# Patient Record
Sex: Female | Born: 1935 | Smoking: Never smoker
Health system: Southern US, Community
[De-identification: ages and names within clinical notes are randomized; demographics above are authoritative.]

## PROBLEM LIST (undated history)

## (undated) DIAGNOSIS — R54 Age-related physical debility: Secondary | ICD-10-CM

## (undated) DIAGNOSIS — I739 Peripheral vascular disease, unspecified: Secondary | ICD-10-CM

## (undated) DIAGNOSIS — C189 Malignant neoplasm of colon, unspecified: Secondary | ICD-10-CM

## (undated) DIAGNOSIS — K5900 Constipation, unspecified: Secondary | ICD-10-CM

## (undated) DIAGNOSIS — I639 Cerebral infarction, unspecified: Secondary | ICD-10-CM

## (undated) DIAGNOSIS — I4891 Unspecified atrial fibrillation: Secondary | ICD-10-CM

## (undated) DIAGNOSIS — D649 Anemia, unspecified: Secondary | ICD-10-CM

## (undated) DIAGNOSIS — G459 Transient cerebral ischemic attack, unspecified: Secondary | ICD-10-CM

## (undated) DIAGNOSIS — F0390 Unspecified dementia without behavioral disturbance: Secondary | ICD-10-CM

## (undated) DIAGNOSIS — D469 Myelodysplastic syndrome, unspecified: Secondary | ICD-10-CM

## (undated) HISTORY — DX: Cerebral infarction, unspecified: I63.9

## (undated) HISTORY — DX: Unspecified dementia, unspecified severity, without behavioral disturbance, psychotic disturbance, mood disturbance, and anxiety: F03.90

## (undated) HISTORY — PX: CAROTID ENDARTERECTOMY: SUR193

## (undated) HISTORY — DX: Peripheral vascular disease, unspecified: I73.9

## (undated) HISTORY — DX: Transient cerebral ischemic attack, unspecified: G45.9

## (undated) HISTORY — DX: Unspecified atrial fibrillation: I48.91

## (undated) HISTORY — DX: Malignant neoplasm of colon, unspecified: C18.9

## (undated) HISTORY — DX: Myelodysplastic syndrome, unspecified: D46.9

## (undated) HISTORY — PX: SMALL INTESTINE SURGERY: SHX150

## (undated) HISTORY — DX: Age-related physical debility: R54

## (undated) HISTORY — DX: Anemia, unspecified: D64.9

## (undated) HISTORY — DX: Constipation, unspecified: K59.00

---

## 2004-02-05 ENCOUNTER — Other Ambulatory Visit: Payer: Self-pay

## 2004-05-01 ENCOUNTER — Other Ambulatory Visit: Payer: Self-pay

## 2004-06-18 ENCOUNTER — Ambulatory Visit: Payer: Self-pay | Admitting: Oncology

## 2004-07-19 ENCOUNTER — Ambulatory Visit: Payer: Self-pay | Admitting: Oncology

## 2004-08-18 ENCOUNTER — Ambulatory Visit: Payer: Self-pay | Admitting: Oncology

## 2004-09-18 ENCOUNTER — Ambulatory Visit: Payer: Self-pay | Admitting: Oncology

## 2004-10-19 ENCOUNTER — Ambulatory Visit: Payer: Self-pay | Admitting: Oncology

## 2004-11-16 ENCOUNTER — Ambulatory Visit: Payer: Self-pay | Admitting: Oncology

## 2005-01-05 ENCOUNTER — Ambulatory Visit: Payer: Self-pay | Admitting: Oncology

## 2005-02-02 ENCOUNTER — Ambulatory Visit: Payer: Self-pay | Admitting: Oncology

## 2005-02-06 ENCOUNTER — Other Ambulatory Visit: Payer: Self-pay

## 2005-02-06 ENCOUNTER — Emergency Department: Payer: Self-pay | Admitting: Emergency Medicine

## 2005-02-16 ENCOUNTER — Ambulatory Visit: Payer: Self-pay | Admitting: Oncology

## 2005-03-18 ENCOUNTER — Ambulatory Visit: Payer: Self-pay | Admitting: Oncology

## 2005-04-18 ENCOUNTER — Ambulatory Visit: Payer: Self-pay | Admitting: Oncology

## 2005-05-19 ENCOUNTER — Ambulatory Visit: Payer: Self-pay | Admitting: Oncology

## 2005-06-18 ENCOUNTER — Ambulatory Visit: Payer: Self-pay | Admitting: Oncology

## 2005-07-19 ENCOUNTER — Ambulatory Visit: Payer: Self-pay | Admitting: Oncology

## 2005-08-18 ENCOUNTER — Ambulatory Visit: Payer: Self-pay | Admitting: Oncology

## 2005-09-18 ENCOUNTER — Ambulatory Visit: Payer: Self-pay | Admitting: Oncology

## 2005-10-19 ENCOUNTER — Ambulatory Visit: Payer: Self-pay | Admitting: Oncology

## 2005-11-16 ENCOUNTER — Ambulatory Visit: Payer: Self-pay | Admitting: Oncology

## 2005-12-17 ENCOUNTER — Ambulatory Visit: Payer: Self-pay | Admitting: Oncology

## 2006-01-16 ENCOUNTER — Ambulatory Visit: Payer: Self-pay | Admitting: Oncology

## 2006-02-16 ENCOUNTER — Ambulatory Visit: Payer: Self-pay | Admitting: Oncology

## 2006-03-20 ENCOUNTER — Ambulatory Visit: Payer: Self-pay | Admitting: Oncology

## 2006-05-02 ENCOUNTER — Ambulatory Visit: Payer: Self-pay | Admitting: Oncology

## 2006-05-04 ENCOUNTER — Ambulatory Visit: Payer: Self-pay | Admitting: Internal Medicine

## 2006-05-19 ENCOUNTER — Ambulatory Visit: Payer: Self-pay | Admitting: Oncology

## 2006-06-07 ENCOUNTER — Ambulatory Visit: Payer: Self-pay | Admitting: Gastroenterology

## 2006-07-11 ENCOUNTER — Ambulatory Visit: Payer: Self-pay | Admitting: Oncology

## 2006-07-19 ENCOUNTER — Ambulatory Visit: Payer: Self-pay | Admitting: Oncology

## 2006-08-27 ENCOUNTER — Emergency Department: Payer: Self-pay | Admitting: Emergency Medicine

## 2006-08-27 ENCOUNTER — Other Ambulatory Visit: Payer: Self-pay

## 2006-09-05 ENCOUNTER — Ambulatory Visit: Payer: Self-pay | Admitting: Ophthalmology

## 2006-09-05 ENCOUNTER — Other Ambulatory Visit: Payer: Self-pay

## 2006-09-26 ENCOUNTER — Ambulatory Visit: Payer: Self-pay | Admitting: Ophthalmology

## 2006-10-03 ENCOUNTER — Ambulatory Visit: Payer: Self-pay | Admitting: Oncology

## 2006-10-19 ENCOUNTER — Ambulatory Visit: Payer: Self-pay | Admitting: Oncology

## 2006-11-17 ENCOUNTER — Ambulatory Visit: Payer: Self-pay | Admitting: Oncology

## 2006-12-18 ENCOUNTER — Ambulatory Visit: Payer: Self-pay | Admitting: Oncology

## 2007-01-13 ENCOUNTER — Emergency Department: Payer: Self-pay | Admitting: Emergency Medicine

## 2007-01-14 ENCOUNTER — Ambulatory Visit: Payer: Self-pay | Admitting: Oncology

## 2007-01-17 ENCOUNTER — Ambulatory Visit: Payer: Self-pay | Admitting: Oncology

## 2007-03-04 ENCOUNTER — Ambulatory Visit: Payer: Self-pay | Admitting: Oncology

## 2007-03-19 ENCOUNTER — Ambulatory Visit: Payer: Self-pay | Admitting: Oncology

## 2007-05-20 ENCOUNTER — Ambulatory Visit: Payer: Self-pay | Admitting: Oncology

## 2007-06-03 ENCOUNTER — Ambulatory Visit: Payer: Self-pay | Admitting: Oncology

## 2007-06-19 ENCOUNTER — Ambulatory Visit: Payer: Self-pay | Admitting: Oncology

## 2007-08-19 ENCOUNTER — Ambulatory Visit: Payer: Self-pay | Admitting: Oncology

## 2007-09-04 ENCOUNTER — Ambulatory Visit: Payer: Self-pay | Admitting: Oncology

## 2007-09-19 ENCOUNTER — Ambulatory Visit: Payer: Self-pay | Admitting: Oncology

## 2007-10-20 ENCOUNTER — Ambulatory Visit: Payer: Self-pay | Admitting: Oncology

## 2007-11-17 ENCOUNTER — Ambulatory Visit: Payer: Self-pay | Admitting: Oncology

## 2008-02-17 ENCOUNTER — Ambulatory Visit: Payer: Self-pay | Admitting: Oncology

## 2008-03-12 ENCOUNTER — Ambulatory Visit: Payer: Self-pay | Admitting: Oncology

## 2008-03-18 ENCOUNTER — Ambulatory Visit: Payer: Self-pay | Admitting: Oncology

## 2008-07-10 ENCOUNTER — Ambulatory Visit: Payer: Self-pay | Admitting: Oncology

## 2008-07-19 ENCOUNTER — Ambulatory Visit: Payer: Self-pay | Admitting: Oncology

## 2008-08-04 ENCOUNTER — Emergency Department: Payer: Self-pay | Admitting: Internal Medicine

## 2008-08-18 ENCOUNTER — Ambulatory Visit: Payer: Self-pay | Admitting: Oncology

## 2008-08-21 ENCOUNTER — Emergency Department: Payer: Self-pay | Admitting: Emergency Medicine

## 2008-08-28 ENCOUNTER — Ambulatory Visit: Payer: Self-pay | Admitting: Oncology

## 2008-09-18 ENCOUNTER — Ambulatory Visit: Payer: Self-pay | Admitting: Oncology

## 2008-12-17 ENCOUNTER — Ambulatory Visit: Payer: Self-pay | Admitting: Oncology

## 2008-12-30 ENCOUNTER — Ambulatory Visit: Payer: Self-pay | Admitting: Oncology

## 2009-01-16 ENCOUNTER — Ambulatory Visit: Payer: Self-pay | Admitting: Oncology

## 2009-06-18 ENCOUNTER — Ambulatory Visit: Payer: Self-pay | Admitting: Oncology

## 2009-06-30 ENCOUNTER — Ambulatory Visit: Payer: Self-pay | Admitting: Oncology

## 2009-07-19 ENCOUNTER — Ambulatory Visit: Payer: Self-pay | Admitting: Oncology

## 2009-11-11 ENCOUNTER — Emergency Department: Payer: Self-pay | Admitting: Emergency Medicine

## 2009-12-03 ENCOUNTER — Emergency Department: Payer: Self-pay | Admitting: Emergency Medicine

## 2009-12-13 ENCOUNTER — Emergency Department: Payer: Self-pay | Admitting: Emergency Medicine

## 2009-12-17 ENCOUNTER — Ambulatory Visit: Payer: Self-pay | Admitting: Oncology

## 2009-12-22 ENCOUNTER — Ambulatory Visit: Payer: Self-pay | Admitting: Oncology

## 2010-01-16 ENCOUNTER — Ambulatory Visit: Payer: Self-pay | Admitting: Oncology

## 2010-06-18 ENCOUNTER — Ambulatory Visit: Payer: Self-pay | Admitting: Oncology

## 2010-07-01 ENCOUNTER — Ambulatory Visit: Payer: Self-pay | Admitting: Oncology

## 2010-07-19 ENCOUNTER — Ambulatory Visit: Payer: Self-pay | Admitting: Oncology

## 2010-08-06 ENCOUNTER — Emergency Department: Payer: Self-pay | Admitting: Emergency Medicine

## 2010-09-21 ENCOUNTER — Emergency Department: Payer: Self-pay | Admitting: Unknown Physician Specialty

## 2010-11-01 ENCOUNTER — Emergency Department: Payer: Self-pay | Admitting: Emergency Medicine

## 2010-11-17 ENCOUNTER — Ambulatory Visit: Payer: Self-pay | Admitting: Oncology

## 2010-11-18 ENCOUNTER — Emergency Department: Payer: Self-pay | Admitting: Emergency Medicine

## 2010-11-19 ENCOUNTER — Inpatient Hospital Stay: Payer: Self-pay | Admitting: Internal Medicine

## 2010-12-18 ENCOUNTER — Ambulatory Visit: Payer: Self-pay | Admitting: Oncology

## 2012-04-07 IMAGING — CT CT HEAD WITHOUT CONTRAST
2 series · 15 of 30 positions shown, 19 images · non-contrast
Comparison: none

REASON FOR EXAM: fell hit chair, apparent bruise on head
COMMENTS:

PROCEDURE:     CT  - CT HEAD WITHOUT CONTRAST  - November 18, 2010 [DATE]
RESULT:     Comparison:  09/21/2010
TECHNIQUE: Multiple axial images from the foramen magnum to the vertex were
obtained without IV contrast.

[Series 2: without · axial · non-contrast · 0.39mm/px · z∈[+906,+1036]mm · 13 of 32 slices shown, 17 images]
[im 3/32  brain]
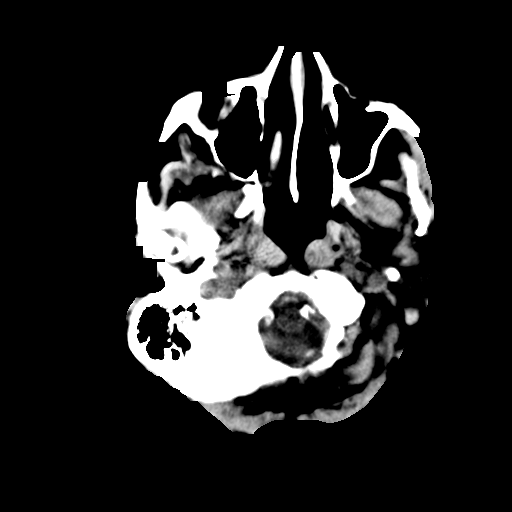
[im 3/32  bone]
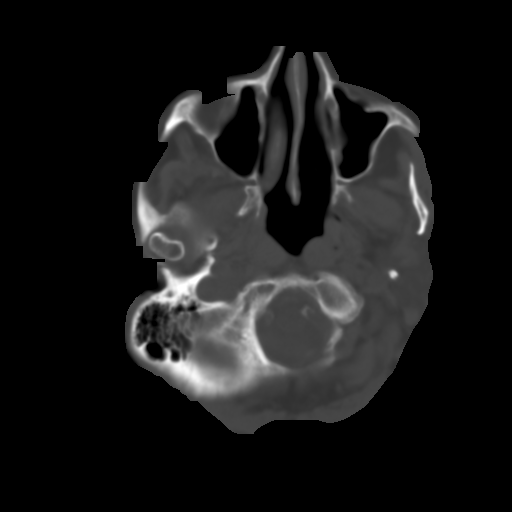
[im 5/32  brain]
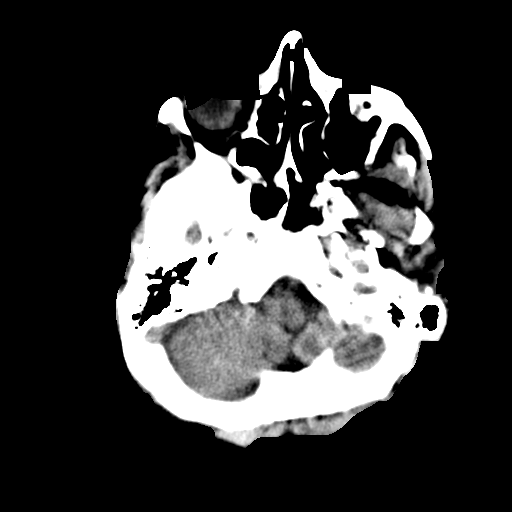
[im 7/32  brain]
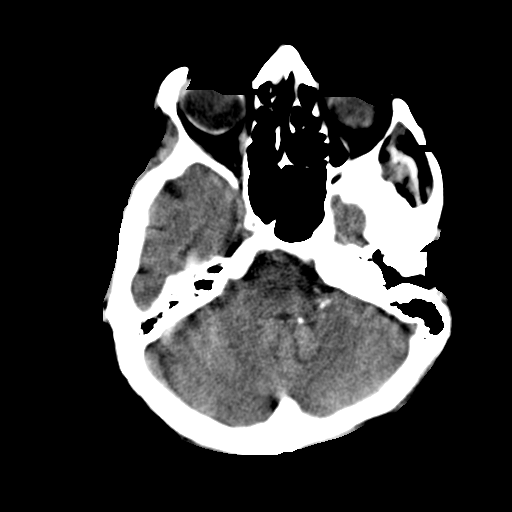
[im 9/32  brain]
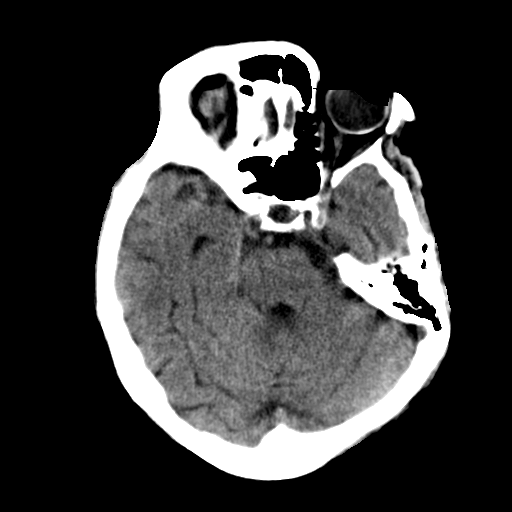
[im 12/32  brain]
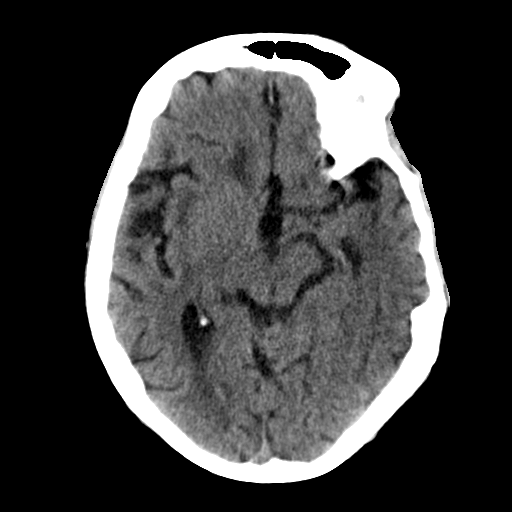
[im 12/32  bone]
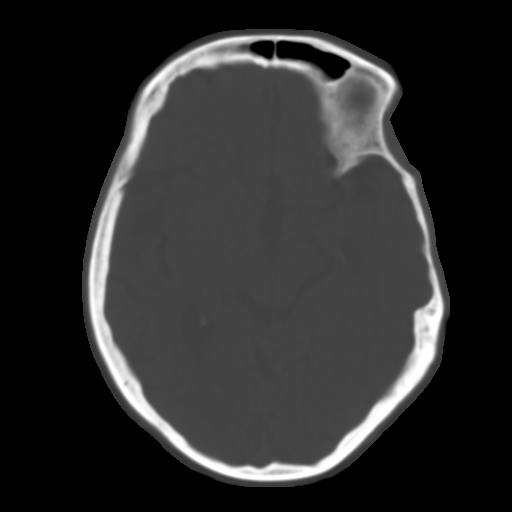
[im 14/32  brain]
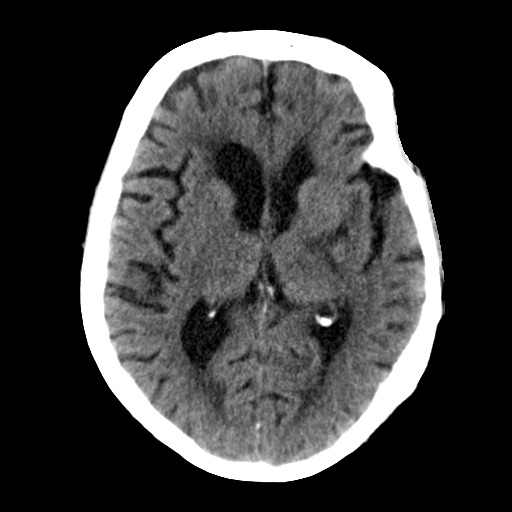
[im 16/32  brain]
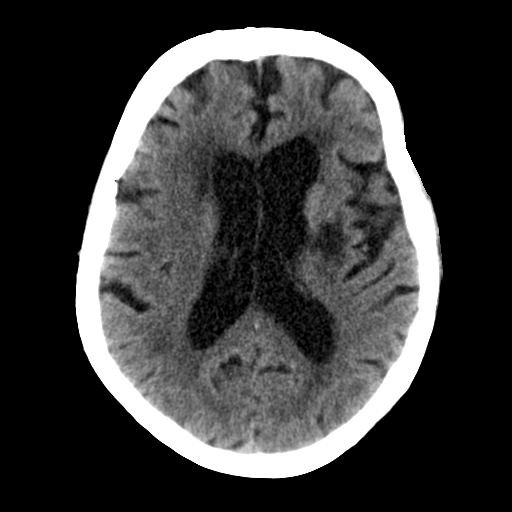
[im 18/32  brain]
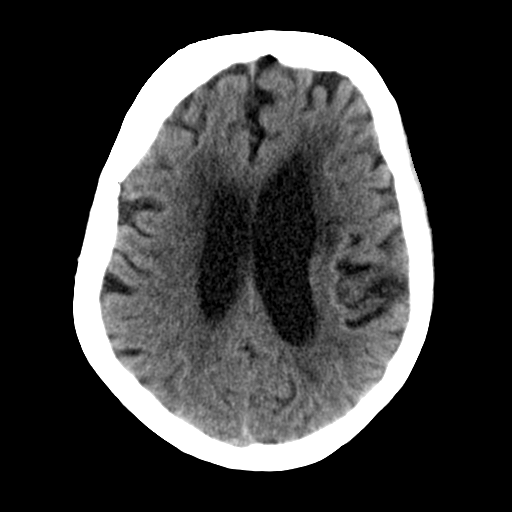
[im 20/32  brain]
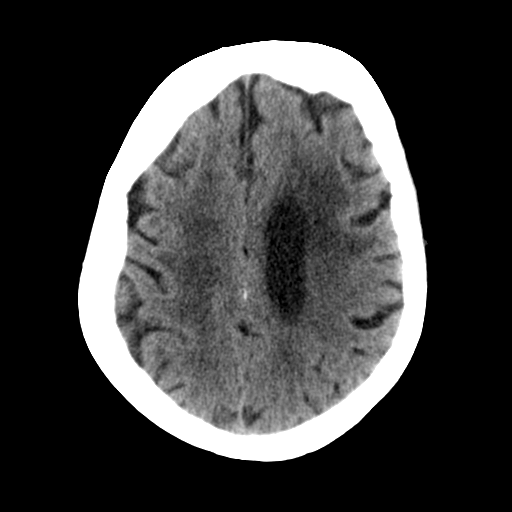
[im 20/32  bone]
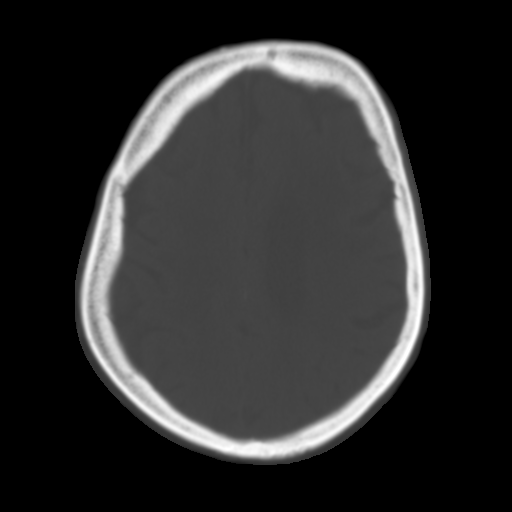
[im 23/32  brain]
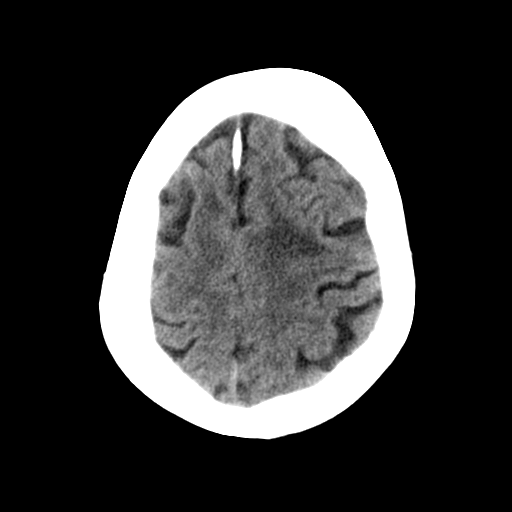
[im 25/32  brain]
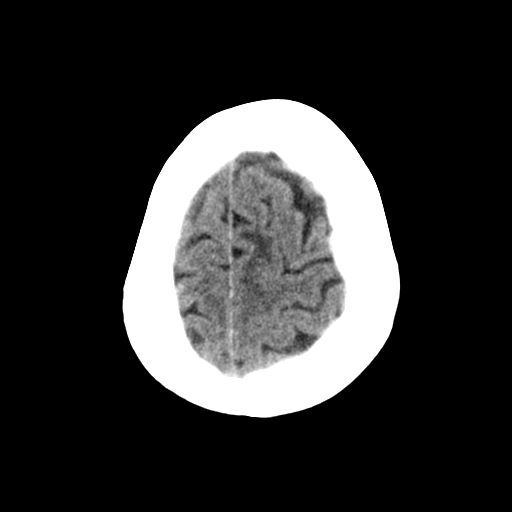
[im 27/32  brain]
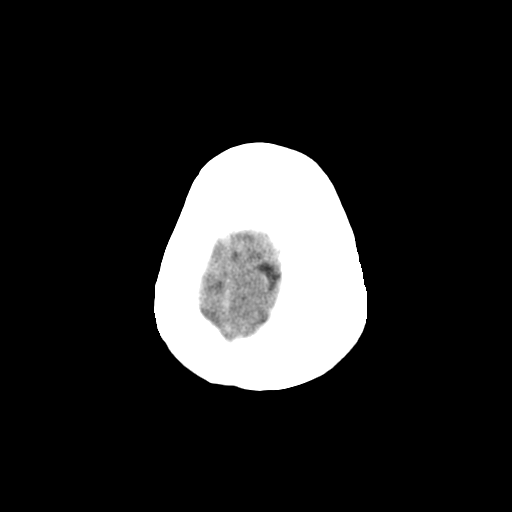
[im 29/32  brain]
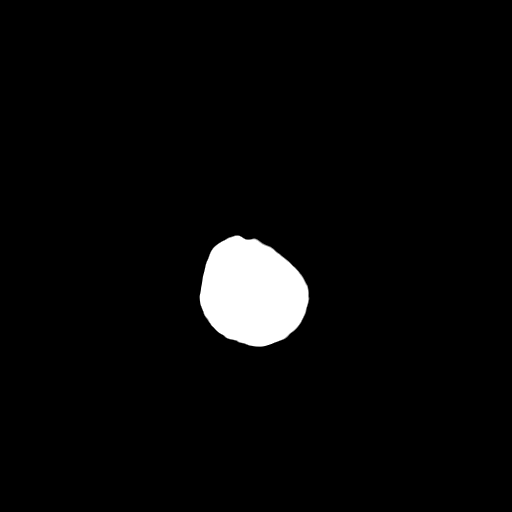
[im 29/32  bone]
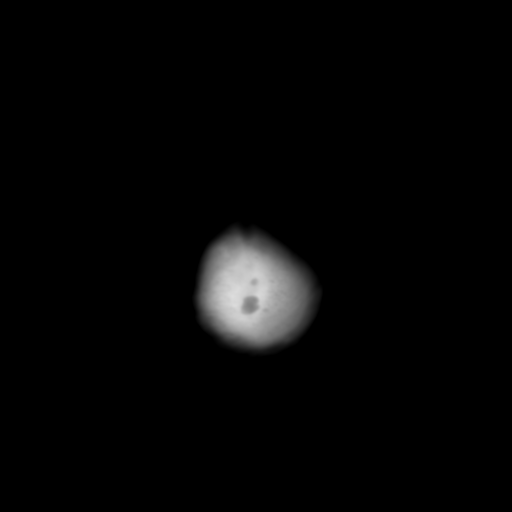

[Series 3: bone · axial · 0.39mm/px · z∈[+906,+926]mm · 2 of 32 slices shown]
[im 3/32  bone]
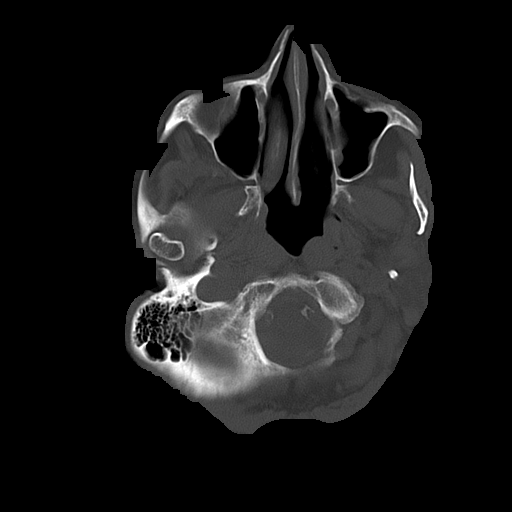
[im 7/32  bone]
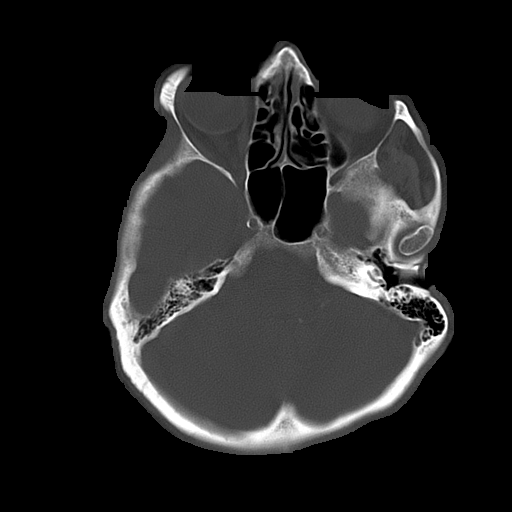

[15 of 30 positions shown; findings below may reference images not displayed]

FINDINGS: There is no evidence of mass effect, midline shift, or extra-axial fluid
collections.  There is no evidence of a space-occupying lesion or
intracranial hemorrhage. There is no evidence of a cortical-based area of
acute infarction. There is an old left lacunar infarct. There is generalized
cerebral atrophy. There is periventricular white matter low attenuation
likely secondary to microangiopathy.

The ventricles and sulci are appropriate for the patient's age. The basal
cisterns are patent.

Visualized portions of the orbits are unremarkable. The visualized portions
of the paranasal sinuses and mastoid air cells are unremarkable.
Cerebrovascular atherosclerotic calcifications are noted.

The osseous structures are unremarkable.
IMPRESSION: No acute intracranial process.

## 2012-04-08 IMAGING — CT CT CERVICAL SPINE WITHOUT CONTRAST
3 series · 14 of 27 positions shown, 17 images · non-contrast
Comparison: None

REASON FOR EXAM: neck pain
COMMENTS:

PROCEDURE:     CT  - CT CERVICAL SPINE WO  - November 19, 2010  [DATE]
RESULT:     Clinical Indication: Trauma
TECHNIQUE: Multiple axial CT images from the skull base to the mid vertebral
body of T1. obtained with sagittal and coronal reformatted images provided.

[Series 5: sagittal · sagittal · 0.22mm/px · 5 of 43 slices shown, 6 images]
[im 15/43  bone]
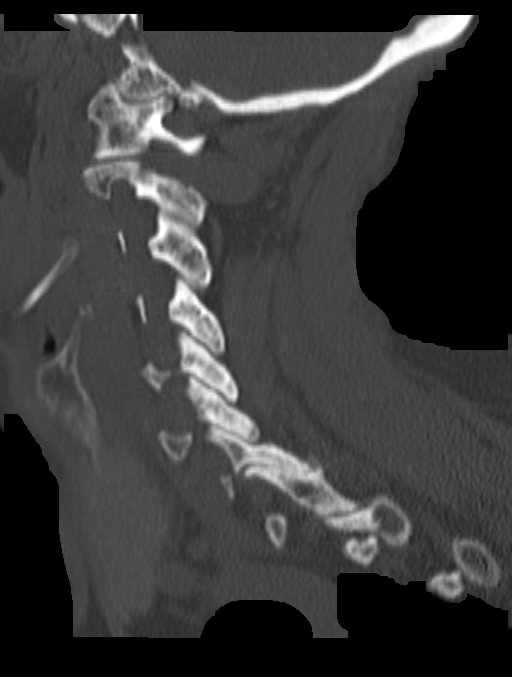
[im 18/43  bone]
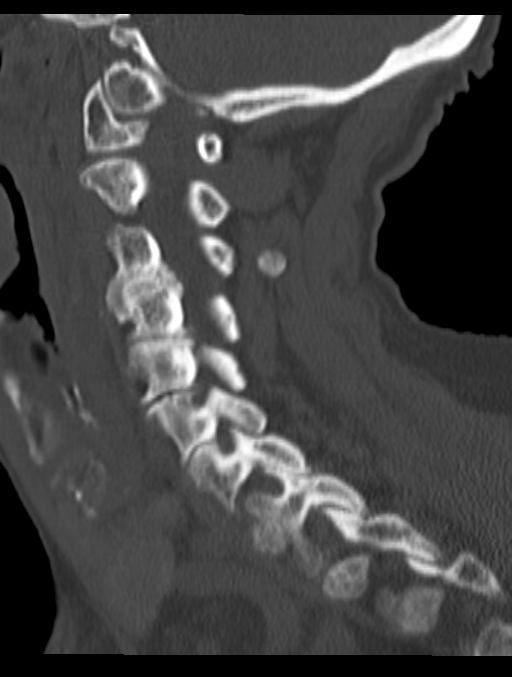
[im 22/43  soft-tissue]
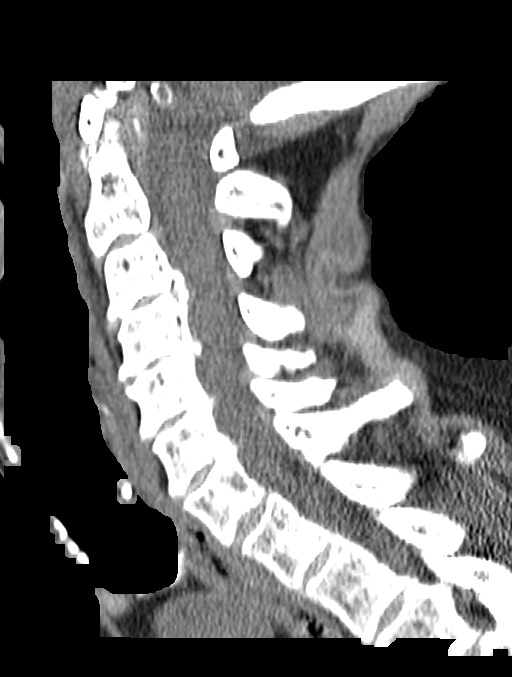
[im 22/43  bone]
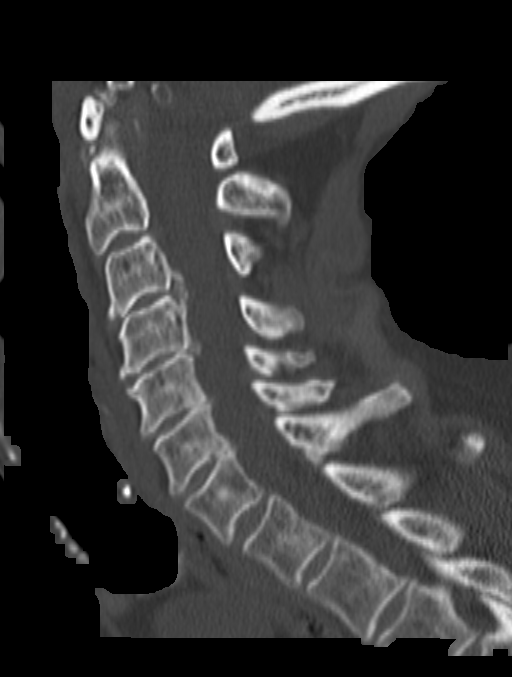
[im 25/43  bone]
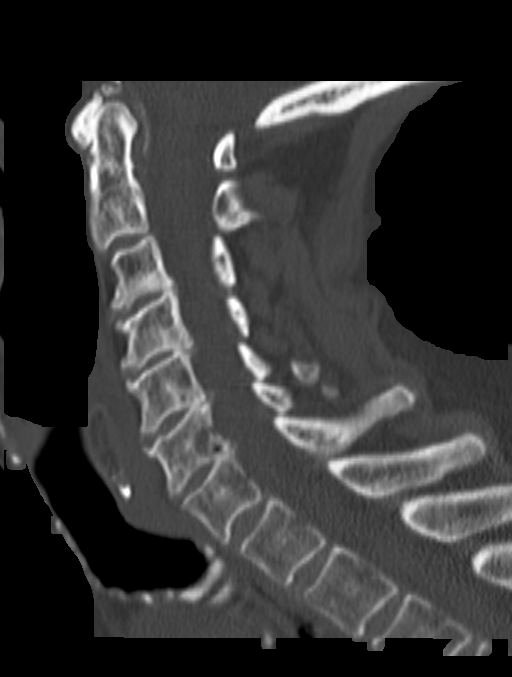
[im 29/43  bone]
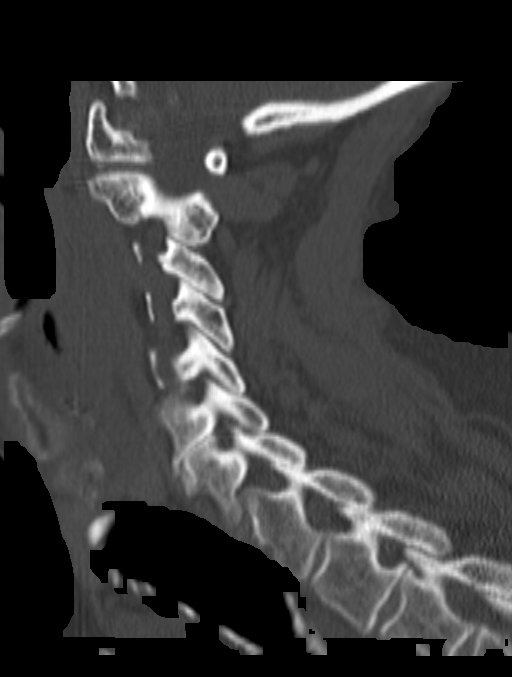

[Series 6: axial · axial · 0.29mm/px · z∈[+847,+943]mm · 5 of 72 slices shown, 7 images (1 of 2)]
[im 12/72  soft-tissue]
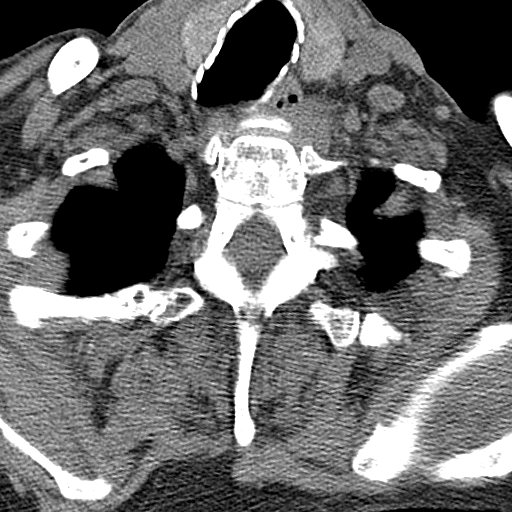
[im 12/72  bone]
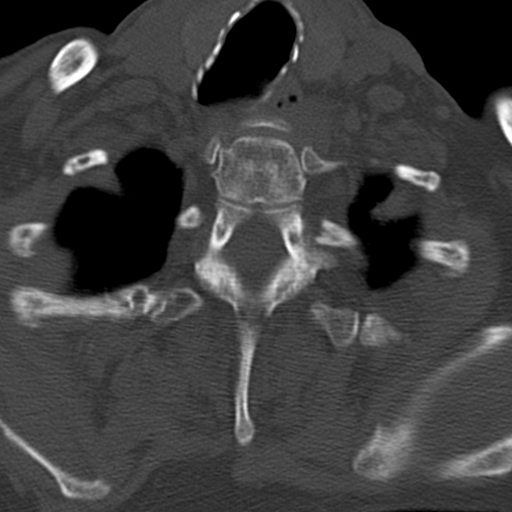
[im 24/72  bone]
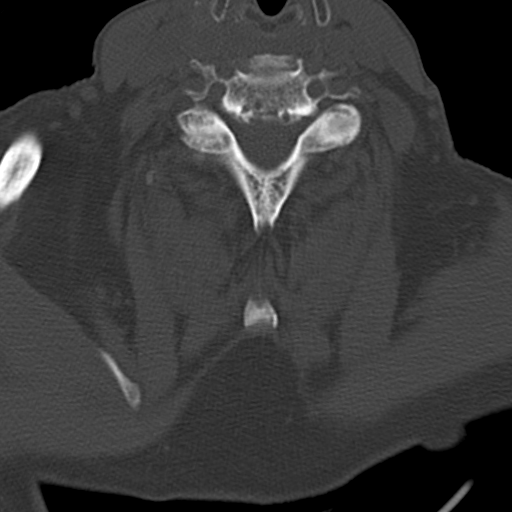
[im 36/72  bone]
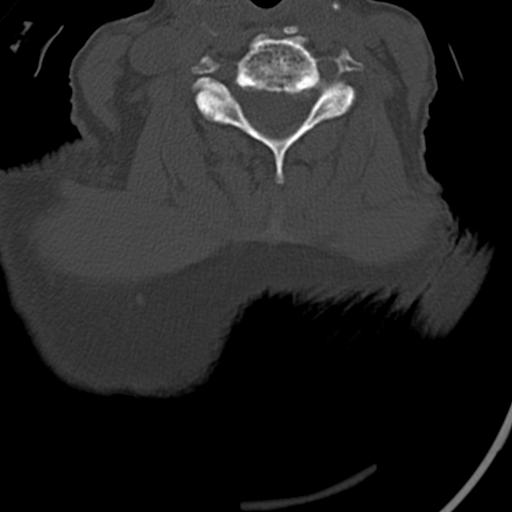
[im 48/72  bone]
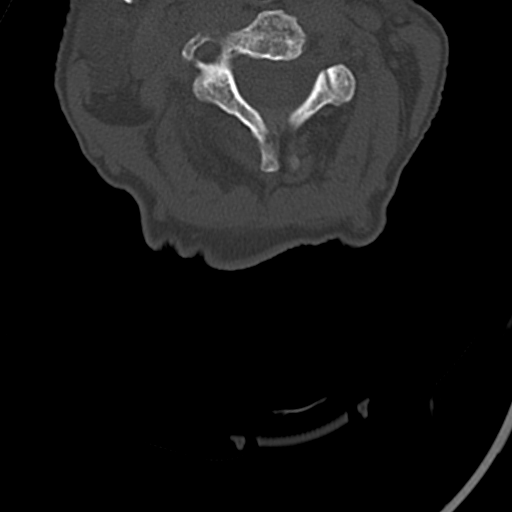
[im 60/72  soft-tissue]
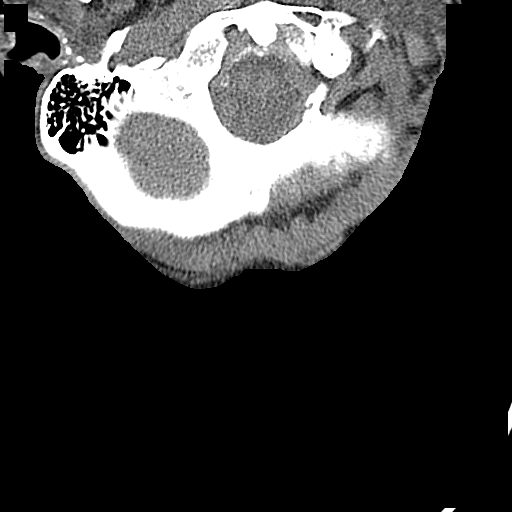
[im 60/72  bone]
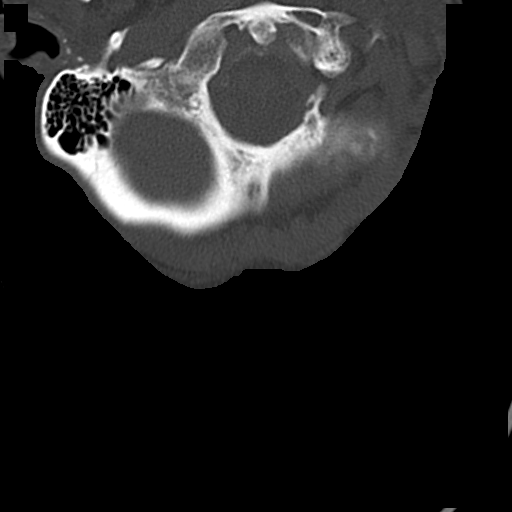

[Series 7: axial · axial · 0.35mm/px · z∈[+846,+914]mm · 4 of 69 slices shown (2 of 2)]
[im 12/69  bone]
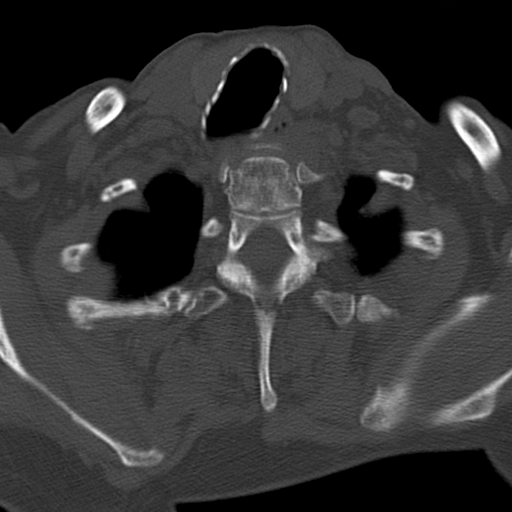
[im 23/69  bone]
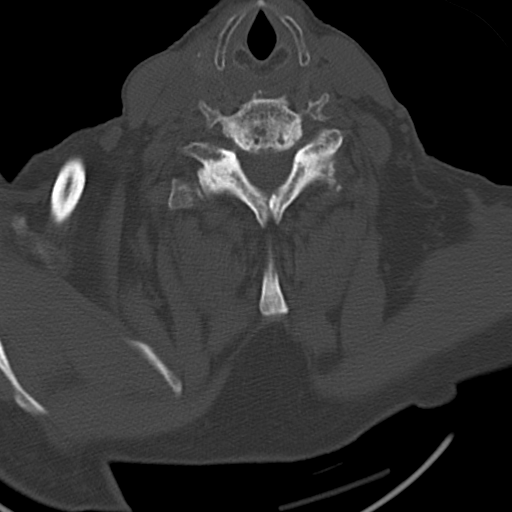
[im 35/69  bone]
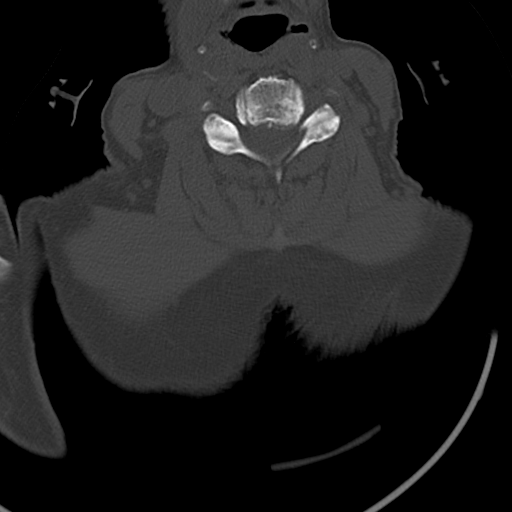
[im 46/69  bone]
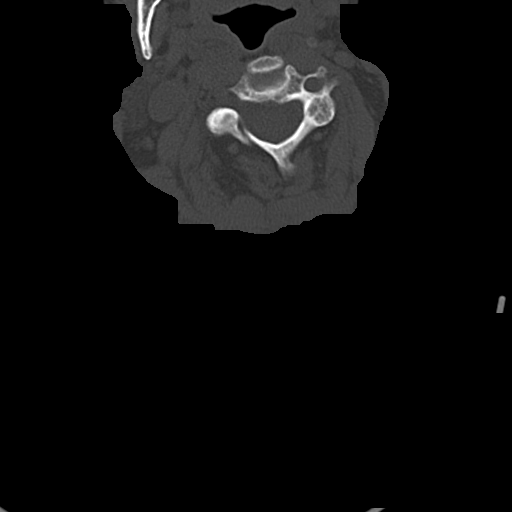

[14 of 27 positions shown; findings below may reference images not displayed]

FINDINGS: The alignment is anatomic. The vertebral body heights are maintained. There
is no acute fracture or static listhesis. The prevertebral soft tissues are
normal. The intraspinal soft tissues are not fully imaged on this
examination due to poor soft tissue contrast, but there is no soft tissue
gross abnormality.

There is severe degenerative disc disease throughout the cervical spine with
the space narrowing and discogenic end plate osteophytes. There are
broad-based disc bulges at C3-C4, C4-C5, C5-C6 and C6-C7. There is bilateral
severe foraminal narrowing at C5-C6 and C6-C7.

The visualized portions of the lung apices demonstrate no focal abnormality.
IMPRESSION: 1. No acute osseous injury of the cervical spine.

2. Ligamentous injury is not evaluated. If there is high clinical concern
for ligamentous injury, consider MRI or flexion/extension radiographs as
clinically indicated and tolerated.

3. Cervical spondylosis as described above.

## 2013-03-13 ENCOUNTER — Encounter: Payer: Self-pay | Admitting: Nurse Practitioner

## 2013-03-13 DIAGNOSIS — R54 Age-related physical debility: Secondary | ICD-10-CM | POA: Insufficient documentation

## 2013-03-13 DIAGNOSIS — F0390 Unspecified dementia without behavioral disturbance: Secondary | ICD-10-CM | POA: Insufficient documentation

## 2013-03-13 DIAGNOSIS — I4891 Unspecified atrial fibrillation: Secondary | ICD-10-CM | POA: Insufficient documentation

## 2013-03-13 DIAGNOSIS — I739 Peripheral vascular disease, unspecified: Secondary | ICD-10-CM | POA: Insufficient documentation

## 2013-03-13 DIAGNOSIS — K5900 Constipation, unspecified: Secondary | ICD-10-CM | POA: Insufficient documentation

## 2013-03-13 DIAGNOSIS — D469 Myelodysplastic syndrome, unspecified: Secondary | ICD-10-CM

## 2013-03-13 DIAGNOSIS — D649 Anemia, unspecified: Secondary | ICD-10-CM | POA: Insufficient documentation

## 2013-03-13 DIAGNOSIS — G459 Transient cerebral ischemic attack, unspecified: Secondary | ICD-10-CM | POA: Insufficient documentation

## 2013-03-13 DIAGNOSIS — I639 Cerebral infarction, unspecified: Secondary | ICD-10-CM | POA: Insufficient documentation

## 2013-04-18 ENCOUNTER — Encounter: Payer: Self-pay | Admitting: Adult Health

## 2013-04-18 ENCOUNTER — Non-Acute Institutional Stay (SKILLED_NURSING_FACILITY): Payer: Medicare Other | Admitting: Adult Health

## 2013-04-18 DIAGNOSIS — I4891 Unspecified atrial fibrillation: Secondary | ICD-10-CM

## 2013-04-18 DIAGNOSIS — G4700 Insomnia, unspecified: Secondary | ICD-10-CM

## 2013-04-18 DIAGNOSIS — F0390 Unspecified dementia without behavioral disturbance: Secondary | ICD-10-CM

## 2013-04-18 DIAGNOSIS — K219 Gastro-esophageal reflux disease without esophagitis: Secondary | ICD-10-CM | POA: Insufficient documentation

## 2013-04-18 DIAGNOSIS — I639 Cerebral infarction, unspecified: Secondary | ICD-10-CM

## 2013-04-18 DIAGNOSIS — I6350 Cerebral infarction due to unspecified occlusion or stenosis of unspecified cerebral artery: Secondary | ICD-10-CM

## 2013-04-18 DIAGNOSIS — K5900 Constipation, unspecified: Secondary | ICD-10-CM

## 2013-04-18 DIAGNOSIS — G8929 Other chronic pain: Secondary | ICD-10-CM | POA: Insufficient documentation

## 2013-04-18 MED ORDER — TRAZODONE 25 MG HALF TABLET: 25.0000 mg | ORAL_TABLET | Freq: Every day | ORAL | Status: DC

## 2013-04-18 NOTE — Assessment & Plan Note (Signed)
Will continue prilosec 20 mg daily  

## 2013-04-18 NOTE — Assessment & Plan Note (Signed)
Heart rate controlled; will continue asa 81 mg daily

## 2013-04-18 NOTE — Assessment & Plan Note (Addendum)
Is worse; will continue melatonin 3 mg nightly and will add trazodone 12.5 mg nightly and will monitor

## 2013-04-18 NOTE — Assessment & Plan Note (Signed)
Is stable will continue tylenol 1 gm three times daily and ultram 50 mg every 8 hours as needed and will monitor

## 2013-04-18 NOTE — Progress Notes (Signed)
Patient ID: Madison Holmes, female   DOB: October 13, 1919, 79 y.o.   MRN: [id number]  [facility]  Allergies  Allergen Reactions  . Iodine      Chief Complaint  Patient presents with  . Medical Managment of Chronic Issues    HPI: She is being seen for the management of her chronic illnesses. There are no concerns being voiced by the nursing staff or by the patient at this time. Overall her status remain unchanged.   Past Medical History  Diagnosis Date  . A-fib 03/13/2013  . Anemia 03/13/2013  . Dementia 03/13/2013  . TIA (transient ischemic attack) 03/13/2013  . Frail elderly 03/13/2013  . Unspecified constipation 03/13/2013  . Myelodysplastic syndrome, unspecified 03/13/2013  . PVD (peripheral vascular disease) 03/13/2013  . CVA (cerebral vascular accident) 03/13/2013    No past surgical history on file.  VITAL SIGNS BP 146/68  Pulse 68  Ht 5\' 5"  (1.651 m)  Wt 97 lb 3.2 oz (44.09 kg)  BMI 16.18 kg/m2   Patient's Medications  New Prescriptions   No medications on file  Previous Medications   ACETAMINOPHEN (TYLENOL) 500 MG TABLET    Take 1,000 mg by mouth 3 (three) times daily.   ASPIRIN 81 MG TABLET    Take 81 mg by mouth daily.   HYDROXYPROPYL METHYLCELLULOSE (ISOPTO TEARS) 2.5 % OPHTHALMIC SOLUTION    Place 1 drop into both eyes 3 (three) times daily.   IPRATROPIUM-ALBUTEROL (DUONEB) 0.5-2.5 (3) MG/3ML SOLN    Take 3 mLs by nebulization every 8 (eight) hours as needed.   MELATONIN 3 MG CAPS    Take 3 mg by mouth at bedtime.   OMEPRAZOLE (PRILOSEC) 20 MG CAPSULE    Take 20 mg by mouth daily.   POLYETHYLENE GLYCOL (MIRALAX / GLYCOLAX) PACKET    Take 17 g by mouth daily.   SENNOSIDES-DOCUSATE SODIUM (SENOKOT-S) 8.6-50 MG TABLET    Take 1 tablet by mouth 2 (two) times daily.   TRAMADOL (ULTRAM) 50 MG TABLET    Take 50 mg by mouth every 8 (eight) hours as needed for pain.  Modified Medications   No medications on file  Discontinued Medications   No medications on file     SIGNIFICANT DIAGNOSTIC EXAMS  12-27-12: right shoulder x-ray: no fracture or dislocation 12-27-12: left shoulder x-ray: no fracture or dislocation; mild degenerative change in the acromioclavicular joint  12-27-12: thoracic spine x-ray: osteoporotic compression fracture at T6 and T8 likely subacute or more remote in nature; moderate bony demineralization; mild dextroscoliosis of lower thoracic spine. 12-27-12: lumbar spine x-ray: no fracture or dislocation involving lumbar spine. Moderate bony demineralization. Mild osteoarthritic change    LABS REVIEWED:   12-26-12: wbc 6.6; hgb 8.3; hct 25.3 ;mcv 102.4; plt 219; glucose 72; bun 15; creat 0.75; k+3.7 Na++136 03-04-13: urine culture: no growth 03-14-13: wbc 5.1; hgb 9.0; hct 29.0 ;mcv 103.2; plt 262; glucose 85; bun 16; creat 0.7; k+3.2 Na++137 03-17-13: k+ 3.6  Review of Systems  Constitutional: Negative for malaise/fatigue.  Respiratory: Negative for cough and shortness of breath.   Cardiovascular: Negative for chest pain, palpitations and leg swelling.  Gastrointestinal: Negative for abdominal pain.  Musculoskeletal:       Has generalized aches and pains which are mild  Skin: Negative.   Neurological: Negative for headaches.  Psychiatric/Behavioral: The patient has insomnia.      Physical Exam  Constitutional:  cathectic   Neck: Neck supple. No JVD present. No thyromegaly present.  Cardiovascular: Normal rate.  Heart rate irregular  Respiratory: Effort normal and breath sounds normal. No respiratory distress. She has no wheezes.  GI: Soft. Bowel sounds are normal. She exhibits no distension. There is no tenderness.  Musculoskeletal: Normal range of motion. She exhibits no edema.  Has kyphosis and scoliosis   Neurological: She is alert.  Skin: Skin is warm and dry.  Psychiatric: She has a normal mood and affect.      ASSESSMENT/ PLAN:   A-fib Heart rate controlled; will continue asa 81 mg daily  GERD  (gastroesophageal reflux disease) Will continue prilosec 20 mg daily   Chronic pain Is stable will continue tylenol 1 gm three times daily and ultram 50 mg every 8 hours as needed and will monitor  Unspecified constipation Will continue miralax 17 gm daily and senna s twice daily   Insomnia Is worse; will continue melatonin 3 mg nightly and will add trazodone 12.5 mg nightly and will monitor   CVA (cerebral vascular accident) No change in status will continue asa 81 mg daily  Dementia No significant change in status; is presently not on medications; will not make changes will monitor her status    Time spent with patient 50 minutes

## 2013-04-18 NOTE — Assessment & Plan Note (Signed)
No change in status will continue asa 81 mg daily  

## 2013-04-18 NOTE — Assessment & Plan Note (Signed)
Will continue miralax 17 gm daily and senna s twice daily

## 2013-04-18 NOTE — Assessment & Plan Note (Signed)
No significant change in status; is presently not on medications; will not make changes will monitor her status

## 2013-05-11 NOTE — Progress Notes (Signed)
This encounter was created in error - please disregard.

## 2013-06-02 ENCOUNTER — Other Ambulatory Visit: Payer: Self-pay | Admitting: *Deleted

## 2013-06-02 MED ORDER — TEMAZEPAM 15 MG PO CAPS: ORAL_CAPSULE | ORAL | Status: DC

## 2013-06-03 ENCOUNTER — Non-Acute Institutional Stay (SKILLED_NURSING_FACILITY): Payer: Medicare Other | Admitting: Nurse Practitioner

## 2013-06-03 DIAGNOSIS — J189 Pneumonia, unspecified organism: Secondary | ICD-10-CM

## 2013-06-03 DIAGNOSIS — K219 Gastro-esophageal reflux disease without esophagitis: Secondary | ICD-10-CM

## 2013-06-03 DIAGNOSIS — D649 Anemia, unspecified: Secondary | ICD-10-CM

## 2013-06-03 DIAGNOSIS — K5900 Constipation, unspecified: Secondary | ICD-10-CM

## 2013-06-03 DIAGNOSIS — I4891 Unspecified atrial fibrillation: Secondary | ICD-10-CM

## 2013-06-17 ENCOUNTER — Non-Acute Institutional Stay (SKILLED_NURSING_FACILITY): Payer: Medicare Other | Admitting: Nurse Practitioner

## 2013-06-17 DIAGNOSIS — D638 Anemia in other chronic diseases classified elsewhere: Secondary | ICD-10-CM

## 2013-06-17 DIAGNOSIS — E876 Hypokalemia: Secondary | ICD-10-CM

## 2013-06-24 ENCOUNTER — Encounter: Payer: Self-pay | Admitting: Nurse Practitioner

## 2013-07-15 NOTE — Progress Notes (Signed)
  06/24/2013  Chief complaint: Concern related to shortness of breath. The patient is being prepared to receive a scope to assess possibility of aspiration.  On physical examination: Patient is alert, neat well-groomed, verbally appropriate, certainly oriented to self and place. Extraocular movements are intact, pupils are equal round reactive to light, no gross funduscopic abnormalities evidenced. TMs are unremarkable. Oropharynx: Dentures in place, no gross abnormalities evidenced. No palpable adenopathy, no apparent carotid bruits. Apical pulse is with a regular rate and rhythm. Some wheezing, from upper airways, appreciated. Otherwise bilateral breath sounds are entirely completely clear. Excellent air movement. Pulse oximetry ranges from 95-97%. Abdomen soft, positive bowel sounds, soft and nondistended.  Bilateral lower extremities with positive posterior tibial pulses. Only mild medial ankle edema. The swallowing study was successfully done.  Impression: Some wheezing, likely more upper airway, believe shortness of breath related to anxiety over having a scope placed through the vocal cords. We'll implement albuterol treatment if necessary for wheezing may be given every 6 hours as indicated. Patient had a very successful swallowing study. Will need to be coached regarding swallowing more slowly. Will order labs as indicated. This encounter was created in error - please disregard.

## 2013-08-06 ENCOUNTER — Non-Acute Institutional Stay (SKILLED_NURSING_FACILITY): Payer: Medicare Other | Admitting: Nurse Practitioner

## 2013-08-06 ENCOUNTER — Non-Acute Institutional Stay: Payer: Medicare Other | Admitting: Nurse Practitioner

## 2013-08-06 DIAGNOSIS — K5900 Constipation, unspecified: Secondary | ICD-10-CM

## 2013-08-06 DIAGNOSIS — N898 Other specified noninflammatory disorders of vagina: Secondary | ICD-10-CM

## 2013-08-06 DIAGNOSIS — D649 Anemia, unspecified: Secondary | ICD-10-CM

## 2013-08-07 NOTE — Progress Notes (Signed)
Patient ID: Madison Holmes, female   DOB: 11-Oct-1919, 79 y.o.   MRN: [id number]    06/24/2013  Chief complaint:  Concern related to shortness of breath. The patient is being prepared to receive a scope to assess possibility of aspiration.  On physical examination:  Patient is alert, neat well-groomed, verbally appropriate, certainly oriented to self and place.  Extraocular movements are intact, pupils are equal round reactive to light, no gross funduscopic abnormalities evidenced.  TMs are unremarkable.  Oropharynx: Dentures in place, no gross abnormalities evidenced.  No palpable adenopathy, no apparent carotid bruits.  Apical pulse is with a regular rate and rhythm.  Some wheezing, from upper airways, appreciated. Otherwise bilateral breath sounds are entirely completely clear. Excellent air movement. Pulse oximetry ranges from 95-97%.  Abdomen soft, positive bowel sounds, soft and nondistended.  Bilateral lower extremities with positive posterior tibial pulses. Only mild medial ankle edema.  The swallowing study was successfully done.  Impression:  Some wheezing, likely more upper airway, believe shortness of breath related to anxiety over having a scope placed through the vocal cords. We'll implement albuterol treatment if necessary for wheezing may be given every 6 hours as indicated.  Patient had a very successful swallowing study. Will need to be coached regarding swallowing more slowly.  Will order labs as indicated.  This encounter was created in error - please disregard.

## 2013-09-01 ENCOUNTER — Non-Acute Institutional Stay (SKILLED_NURSING_FACILITY): Payer: Medicare Other | Admitting: Adult Health

## 2013-09-01 ENCOUNTER — Encounter: Payer: Self-pay | Admitting: Adult Health

## 2013-09-01 DIAGNOSIS — I6350 Cerebral infarction due to unspecified occlusion or stenosis of unspecified cerebral artery: Secondary | ICD-10-CM

## 2013-09-01 DIAGNOSIS — D649 Anemia, unspecified: Secondary | ICD-10-CM

## 2013-09-01 DIAGNOSIS — F0390 Unspecified dementia without behavioral disturbance: Secondary | ICD-10-CM

## 2013-09-01 DIAGNOSIS — G8929 Other chronic pain: Secondary | ICD-10-CM

## 2013-09-01 DIAGNOSIS — K219 Gastro-esophageal reflux disease without esophagitis: Secondary | ICD-10-CM

## 2013-09-01 DIAGNOSIS — I4891 Unspecified atrial fibrillation: Secondary | ICD-10-CM

## 2013-09-01 DIAGNOSIS — I639 Cerebral infarction, unspecified: Secondary | ICD-10-CM

## 2013-09-01 DIAGNOSIS — K5900 Constipation, unspecified: Secondary | ICD-10-CM

## 2013-09-01 DIAGNOSIS — G4700 Insomnia, unspecified: Secondary | ICD-10-CM

## 2013-09-01 NOTE — Progress Notes (Signed)
Patient ID: Madison Holmes, female   DOB: 10-24-1919, 79 y.o.   MRN: [id number]     [facility]  Allergies  Allergen Reactions  . Iodine      Chief Complaint  Patient presents with  . Medical Managment of Chronic Issues    HPI: She is being seen for the management of her chronic illnesses. She is constipated with her abdomen distended and firm. She is having increased lower extremity edema present. i have spoken with her family and they are aware of her status.   Past Medical History  Diagnosis Date  . A-fib 03/13/2013  . Anemia 03/13/2013  . Dementia 03/13/2013  . TIA (transient ischemic attack) 03/13/2013  . Frail elderly 03/13/2013  . Unspecified constipation 03/13/2013  . Myelodysplastic syndrome, unspecified 03/13/2013  . PVD (peripheral vascular disease) 03/13/2013  . CVA (cerebral vascular accident) 03/13/2013    No past surgical history on file.  VITAL SIGNS BP 116/72  Pulse 68  Ht 5\' 5"  (1.651 m)  Wt 98 lb (44.453 kg)  BMI 16.31 kg/m2   Patient's Medications  New Prescriptions   No medications on file  Previous Medications   ACETAMINOPHEN (TYLENOL) 500 MG TABLET    Take 1,000 mg by mouth 3 (three) times daily.   ASPIRIN 81 MG TABLET    Take 81 mg by mouth daily.   HYDROXYPROPYL METHYLCELLULOSE (ISOPTO TEARS) 2.5 % OPHTHALMIC SOLUTION    Place 1 drop into both eyes 3 (three) times daily.   MELATONIN 3 MG CAPS    Take 6 mg by mouth at bedtime.    OMEPRAZOLE (PRILOSEC) 20 MG CAPSULE    Take 20 mg by mouth 2 (two) times daily before a meal.    POLYETHYLENE GLYCOL (MIRALAX / GLYCOLAX) PACKET    Take 17 g by mouth daily.   SENNOSIDES-DOCUSATE SODIUM (SENOKOT-S) 8.6-50 MG TABLET    Take 1 tablet by mouth 2 (two) times daily.   TRAZODONE (DESYREL) 25 MG TABS    Take 0.5 tablets (25 mg total) by mouth at bedtime.  Modified Medications   No medications on file  Discontinued Medications   IPRATROPIUM-ALBUTEROL (DUONEB) 0.5-2.5 (3) MG/3ML SOLN    Take 3 mLs by  nebulization every 8 (eight) hours as needed.   TEMAZEPAM (RESTORIL) 15 MG CAPSULE    Take one capsule by mouth every night at bedtime as needed while Trazodone on hold, then DC   TRAMADOL (ULTRAM) 50 MG TABLET    Take 50 mg by mouth every 8 (eight) hours as needed for pain.    SIGNIFICANT DIAGNOSTIC EXAMS   12-27-12: right shoulder x-ray: no fracture or dislocation 12-27-12: left shoulder x-ray: no fracture or dislocation; mild degenerative change in the acromioclavicular joint  12-27-12: thoracic spine x-ray: osteoporotic compression fracture at T6 and T8 likely subacute or more remote in nature; moderate bony demineralization; mild dextroscoliosis of lower thoracic spine. 12-27-12: lumbar spine x-ray: no fracture or dislocation involving lumbar spine. Moderate bony demineralization. Mild osteoarthritic change   08-06-13: kub: no bowel obstruction or ileus; no significant abnormal calcifications within the abdomen; mild to moderate retained feces at the rectosigmoid colon   LABS REVIEWED:   12-26-12: wbc 6.6; hgb 8.3; hct 25.3 ;mcv 102.4; plt 219; glucose 72; bun 15; creat 0.75; k+3.7 Na++136 03-04-13: urine culture: no growth 03-14-13: wbc 5.1; hgb 9.0; hct 29.0 ;mcv 103.2; plt 262; glucose 85; bun 16; creat 0.7; k+3.2 Na++137 03-17-13: k+ 3.6 05-21-13: wbc 7.6; hgb 7.6 hct 25.1; mcv 303.3; plt  302;  05-30-13: wbc 6.4; hgb 7.9; hct 26.6; mcv 104.3; plt 264 06-20-13: glucose 115; bun 19; creat 0.6; k+3.7; na++132 07-07-13: glucose 87;bun 11; creat 0.7; k+4.0; na+ 132 07-21-13: iron 39; ferritin 444; folic acid 14.040; vit b12: 201 08-28-13: glucose 149; bun 17; creat 0.5; k+4.0; na++ 133    Review of Systems  Constitutional: Negative for malaise/fatigue.  Respiratory: Negative for cough and shortness of breath.   Cardiovascular: Negative for chest pain, palpitations and leg swelling.  Gastrointestinal: Negative for abdominal pain. constipation  Musculoskeletal: no pain present.  Skin:  Negative.   Neurological: Negative for headaches.  Psychiatric/Behavioral: The patient no insomnia     Physical Exam  Constitutional:  cathectic   Neck: Neck supple. No JVD present. No thyromegaly present.  Cardiovascular: Normal rate.   Heart rate irregular  Respiratory: Effort normal and breath sounds normal. No respiratory distress. She has no wheezes.  GI: Soft. Bowel sounds are normal. Abdomen is firm and slightly distended  Musculoskeletal: Normal range of motion. She exhibits 2+ bilateral pedal edema  Has kyphosis and scoliosis   Neurological: She is alert.  Skin: Skin is warm and dry.  Psychiatric: She has a normal mood and affect.      ASSESSMENT/ PLAN:   A-fib Heart rate controlled; will continue asa 81 mg daily  GERD (gastroesophageal reflux disease) Will continue prilosec 20 mg twice daily   Chronic pain Is stable will continue tylenol 1 gm three times daily will monitor her status   Unspecified constipation Is worse; Will continue will change her miralax to twice daily and senna s 2 tabs twice daily and will monitor; she has had constipation long term.    Insomnia Is stable will continue trazodone 25 mg nightly and melatonin 6 mg nightly and will monitor   CVA (cerebral vascular accident) No change in status will continue asa 81 mg daily  Dementia No significant change in status; is presently not on medications; will not make changes will monitor her status

## 2013-09-01 NOTE — Progress Notes (Signed)
Patient ID: Madison Holmes, female   DOB: [birth year], 79 y.o.   MRN: [id number] Will begin lasix 20 mg daily for her edema and will check bmp in 2 weeks will monitor

## 2013-09-03 ENCOUNTER — Encounter: Payer: Self-pay | Admitting: Adult Health

## 2013-09-03 ENCOUNTER — Non-Acute Institutional Stay (SKILLED_NURSING_FACILITY): Payer: Medicare Other | Admitting: Adult Health

## 2013-09-03 DIAGNOSIS — I4891 Unspecified atrial fibrillation: Secondary | ICD-10-CM

## 2013-09-03 DIAGNOSIS — I509 Heart failure, unspecified: Secondary | ICD-10-CM

## 2013-09-03 NOTE — Progress Notes (Signed)
Patient ID: Madison Holmes, female   DOB: Nov 29, 1919, 79 y.o.   MRN: [id number]     [facility]  Allergies  Allergen Reactions  . Iodine      Chief Complaint  Patient presents with  . Acute Visit    edema    HPI: Staff reports that she has increased edema present. Her lower legs are weeping. She has increased respiratory effort present. She was started on lasix 20 mg on 09-01-13. Her family is present; we did talk about her having chf; what the coarse of treatment would be and that her heart rate is now irregular.   Past Medical History  Diagnosis Date  . A-fib 03/13/2013  . Anemia 03/13/2013  . Dementia 03/13/2013  . TIA (transient ischemic attack) 03/13/2013  . Frail elderly 03/13/2013  . Unspecified constipation 03/13/2013  . Myelodysplastic syndrome, unspecified 03/13/2013  . PVD (peripheral vascular disease) 03/13/2013  . CVA (cerebral vascular accident) 03/13/2013    No past surgical history on file.  VITAL SIGNS BP 155/81  Pulse 96  Ht 5\' 5"  (1.651 m)  Wt 98 lb (44.453 kg)  BMI 16.31 kg/m2   Patient's Medications  New Prescriptions   No medications on file  Previous Medications   ACETAMINOPHEN (TYLENOL) 500 MG TABLET    Take 1,000 mg by mouth 3 (three) times daily.   ASPIRIN 81 MG TABLET    Take 81 mg by mouth daily.   FUROSEMIDE (LASIX) 20 MG TABLET    Take 20 mg by mouth daily.   HYDROXYPROPYL METHYLCELLULOSE (ISOPTO TEARS) 2.5 % OPHTHALMIC SOLUTION    Place 1 drop into both eyes 3 (three) times daily.   MELATONIN 3 MG CAPS    Take 6 mg by mouth at bedtime.    OMEPRAZOLE (PRILOSEC) 20 MG CAPSULE    Take 20 mg by mouth 2 (two) times daily before a meal.    POLYETHYLENE GLYCOL (MIRALAX / GLYCOLAX) PACKET    Take 17 g by mouth 2 (two) times daily.    SENNOSIDES-DOCUSATE SODIUM (SENOKOT-S) 8.6-50 MG TABLET    Take 2 tablets by mouth 2 (two) times daily.    TRAZODONE (DESYREL) 25 MG TABS    Take 0.5 tablets (25 mg total) by mouth at bedtime.  Modified Medications    No medications on file  Discontinued Medications   No medications on file    SIGNIFICANT DIAGNOSTIC EXAMS   12-27-12: right shoulder x-ray: no fracture or dislocation 12-27-12: left shoulder x-ray: no fracture or dislocation; mild degenerative change in the acromioclavicular joint  12-27-12: thoracic spine x-ray: osteoporotic compression fracture at T6 and T8 likely subacute or more remote in nature; moderate bony demineralization; mild dextroscoliosis of lower thoracic spine. 12-27-12: lumbar spine x-ray: no fracture or dislocation involving lumbar spine. Moderate bony demineralization. Mild osteoarthritic change   08-06-13: kub: no bowel obstruction or ileus; no significant abnormal calcifications within the abdomen; mild to moderate retained feces at the rectosigmoid colon   LABS REVIEWED:   12-26-12: wbc 6.6; hgb 8.3; hct 25.3 ;mcv 102.4; plt 219; glucose 72; bun 15; creat 0.75; k+3.7 Na++136 03-04-13: urine culture: no growth 03-14-13: wbc 5.1; hgb 9.0; hct 29.0 ;mcv 103.2; plt 262; glucose 85; bun 16; creat 0.7; k+3.2 Na++137 03-17-13: k+ 3.6 05-21-13: wbc 7.6; hgb 7.6 hct 25.1; mcv 303.3; plt 302;  05-30-13: wbc 6.4; hgb 7.9; hct 26.6; mcv 104.3; plt 264 06-20-13: glucose 115; bun 19; creat 0.6; k+3.7; na++132 07-07-13: glucose 87;bun 11; creat 0.7; k+4.0; na+ 132  07-21-13: iron 39; ferritin 444; folic acid 14.040; vit b12: 201 08-28-13: glucose 149; bun 17; creat 0.5; k+4.0; na++ 133    Review of Systems  Constitutional: Negative for malaise/fatigue.  Respiratory: Negative for cough and shortness of breath.   Cardiovascular: Negative for chest pain, palpitations positive leg edema   Gastrointestinal: Negative for abdominal pain. Musculoskeletal: no pain present.  Skin: Negative.   Neurological: Negative for headaches.  Psychiatric/Behavioral: The patient no insomnia     Physical Exam  Constitutional: No distress.  Cathectic   Neck: Neck supple. JVD present. No thyromegaly  present.  Cardiovascular: Normal rate and intact distal pulses.   Heart rate irregular   Respiratory:  Has increased respiratory effort present; has diminished lung sounds bilaterally  GI: Soft. Bowel sounds are normal. She exhibits no distension. There is no tenderness.  Musculoskeletal: Normal range of motion. She exhibits edema.  Has 4+ pitting edema thighs down  Neurological: She is alert.  Skin: Skin is warm and dry. She is not diaphoretic.      ASSESSMENT/ PLAN:  1. Afib: will get an ekg; will continue her asa 81 mg daily and will continue to monitor her status   2. Chf: will get a stat chest x-ray: will give her lasix 40 mg im now; will increase her lasix to 20 mg twice daily and will begin k+ 20 meq daily for k+ supplement. In the am will check BNP and bmp. May need to get a 2-d echo based upon her chest x-ray results.

## 2013-09-05 ENCOUNTER — Non-Acute Institutional Stay (SKILLED_NURSING_FACILITY): Payer: Medicare Other | Admitting: Adult Health

## 2013-09-05 DIAGNOSIS — I4891 Unspecified atrial fibrillation: Secondary | ICD-10-CM

## 2013-09-05 DIAGNOSIS — I509 Heart failure, unspecified: Secondary | ICD-10-CM

## 2013-09-05 DIAGNOSIS — J189 Pneumonia, unspecified organism: Secondary | ICD-10-CM

## 2013-09-05 NOTE — Progress Notes (Signed)
This encounter was created in error - please disregard.

## 2013-09-05 NOTE — Progress Notes (Signed)
June 03, 2013 [address]  Patient ID: Madison Holmes, female   DOB: Oct 10, 1919, 79 y.o.   MRN: [id number]  Allergies  Allergen Reactions  . Iodine     Chief Complaint  Patient presents with  . Acute Visit   HPI:  Asked by Nursing staff to eval pt for the possibility of a rash consistent with an allergic reaction. Pt began with respiratory symptoms and had a chest x-ray, which showed pneumonitis.  She was started on Avelox 400 mg per day day x 10 days.     Past Medical History  Diagnosis Date  . A-fib 03/13/2013  . Anemia 03/13/2013  . Dementia 03/13/2013  . TIA (transient ischemic attack) 03/13/2013  . Frail elderly 03/13/2013  . Unspecified constipation 03/13/2013  . Myelodysplastic syndrome, unspecified 03/13/2013  . PVD (peripheral vascular disease) 03/13/2013  . CVA (cerebral vascular accident) 03/13/2013   No past surgical history on file.  Current Outpatient Prescriptions on File Prior to Visit  Medication Sig Dispense Refill  . acetaminophen (TYLENOL) 500 MG tablet Take 1,000 mg by mouth 3 (three) times daily.      Marland Kitchen aspirin 81 MG tablet Take 81 mg by mouth daily.      . hydroxypropyl methylcellulose (ISOPTO TEARS) 2.5 % ophthalmic solution Place 1 drop into both eyes 3 (three) times daily.      . Melatonin 3 MG CAPS Take 6 mg by mouth at bedtime.       Marland Kitchen omeprazole (PRILOSEC) 20 MG capsule Take 20 mg by mouth 2 (two) times daily before a meal.       . polyethylene glycol (MIRALAX / GLYCOLAX) packet Take 17 g by mouth 2 (two) times daily.       . sennosides-docusate sodium (SENOKOT-S) 8.6-50 MG tablet Take 2 tablets by mouth 2 (two) times daily.       . traZODone (DESYREL) 25 mg TABS Take 0.5 tablets (25 mg total) by mouth at bedtime.  15 tablet  99   No current facility-administered medications on file prior to visit.    Past Radiological Studies 12-27-12: right shoulder x-ray: no fracture or dislocation 12-27-12: left shoulder x-ray: no fracture or  dislocation; mild degenerative change in the acromioclavicular joint   12-27-12: thoracic spine x-ray: osteoporotic compression fracture at T6 and T8 likely subacute or more remote in nature; moderate bony demineralization; mild dextroscoliosis of lower thoracic spine. 12-27-12: lumbar spine x-ray: no fracture or dislocation involving lumbar spine. Moderate bony demineralization. Mild osteoarthritic change      LABS REVIEWED:   12-26-12: wbc 6.6; hgb 8.3; hct 25.3 ;mcv 102.4; plt 219; glucose 72; bun 15; creat 0.75; k+3.7 Na++136 03-04-13: urine culture: no growth 03-14-13: wbc 5.1; hgb 9.0; hct 29.0 ;mcv 103.2; plt 262; glucose 85; bun 16; creat 0.7; k+3.2 Na++137 03-17-13: k+ 3.6 05-21-13: wbc 7.6; hgb 7.6 hct 25.1; mcv 303.3; plt 302;   05-30-13: wbc 6.4; hgb 7.9; hct 26.6; mcv 104.3; plt 264  Review of Systems  Constitutional: Negative for fever and chills.  HENT: Positive for congestion. Negative for ear discharge, ear pain and nosebleeds.   Eyes: Negative for pain, discharge and redness.  Respiratory: Negative for hemoptysis and sputum production.   Cardiovascular: Negative for leg swelling.  Gastrointestinal: Positive for constipation. Negative for blood in stool.  Genitourinary: Negative for hematuria and flank pain.  Skin: Positive for rash. Negative for itching.  Neurological: Negative for speech change, seizures and loss of consciousness.  Psychiatric/Behavioral: Negative for suicidal ideas  and hallucinations.   BP 120/72  Pulse 78  Resp 24  SpO2 96%  Physical Exam  Constitutional:  Thin elderly Caucasian female in no acute distress.  HENT:  Head: Normocephalic and atraumatic.  Right Ear: External ear normal.  Left Ear: External ear normal.  Nasal mucosa on the right side is inflamed, no grossly purulent drainage. Nasal mucosa on the right side essentially unremarkable  Eyes: Conjunctivae are normal. Right eye exhibits no discharge. Left eye exhibits no discharge. No  scleral icterus.  Cataract present in the right eye, red reflex easily elicited in the left eye.  Neck: No JVD present. No thyromegaly present.  Cardiovascular: Normal rate and regular rhythm.   Pulmonary/Chest: Breath sounds normal. No respiratory distress. She has no wheezes. She has no rales.  Respiratory rate 24, bilateral breath sounds are entirely completely clear. Pulse oximetry 96%.  Abdominal: Soft. Bowel sounds are normal. There is no tenderness.  Mild tenderness to palpation  Musculoskeletal: She exhibits edema.  1-2+ pitting edema at the medial ankles any well below the knees.  Lymphadenopathy:    She has no cervical adenopathy.  Neurological: She is alert.  Oriented x2  Skin: Skin is dry. No rash noted.  On examination of the chest, back, upper extremities, and lower extremities, there is no rash consistent with allergic reaction.  Psychiatric: She has a normal mood and affect. Her behavior is normal.   Assessment/Plan: Pneumonitis, c/o allergic reaction to avelox,  I do not see any skin rash or other manifestations of allergic reaction.  Will complete the course of avelox.  Atrial Fibrillation, continue the ASA at 81 mg per day  GERD continue the omeprazole 20 mg twice a day  Constipation continue the Senokot S twice a day and the Miralax 17 gm twice a day  Dry eye, will continue the Isopto tears, 2.5% opthalmic solution 1 drop three times a day.    Insomnia will continue the Melatonin 6 mg and Trazodone one half of a 25 mg tab or 12.5 mg at bedtime  Anemia, chronic, will continue to monitor her CBC and add supplements as necessary.    C/O aspiration would be appropriate for a Speech Therapy evaluation Would also ask for dietary consult to determine food preferences to increase dietary intake.

## 2013-09-05 NOTE — Progress Notes (Addendum)
08/06/2013 CODE STATUS DO NOT RESUSCITATE [name] Rm# [id number]  Patient ID: Madison Holmes, female   DOB: [birth year], 79 y.o.   MRN: [id number]  Allergies  Allergen Reactions  . Iodine    Chief Complaint  Patient presents with  . Acute Visit   HPI:  Nursing staff verbalized concern this a.m., regarding a "red" area near the vaginal opening.     Current Outpatient Prescriptions on File Prior to Visit  Medication Sig Dispense Refill  . acetaminophen (TYLENOL) 500 MG tablet Take 1,000 mg by mouth 3 (three) times daily.      Marland Kitchen aspirin 81 MG tablet Take 81 mg by mouth daily.      . hydroxypropyl methylcellulose (ISOPTO TEARS) 2.5 % ophthalmic solution Place 1 drop into both eyes 3 (three) times daily.      . Melatonin 3 MG CAPS Take 6 mg by mouth at bedtime.       Marland Kitchen omeprazole (PRILOSEC) 20 MG capsule Take 20 mg by mouth 2 (two) times daily before a meal.       . polyethylene glycol (MIRALAX / GLYCOLAX) packet Take 17 g by mouth 2 (two) times daily.       . sennosides-docusate sodium (SENOKOT-S) 8.6-50 MG tablet Take 2 tablets by mouth 2 (two) times daily.       . traZODone (DESYREL) 25 mg TABS Take 0.5 tablets (25 mg total) by mouth at bedtime.  15 tablet  99   No current facility-administered medications on file prior to visit.    Complete blood count from 07/28/2013 WBC 4.4 RBC 2.6 Hemoglobin 7.8 Hematocrit 27.0 MCV 102.7 MCH 29.7 MCHC 28.9 RDW 16.1 Platelets 265  BP 115/69  Pulse 83  Temp(Src) 97.1 F (36.2 C)  Resp 18  Wt 95 lb (43.092 kg)  Weight in October was 92.6 pounds.  Physical Exam  Constitutional:  A thin Caucasian female who is in no acute distress.  HENT:  Head: Normocephalic and atraumatic.  Right Ear: External ear normal.  Left Ear: External ear normal.  Mouth/Throat: No oropharyngeal exudate.  Eyes: Right eye exhibits no discharge. Left eye exhibits no discharge. No scleral icterus.  Neck: No thyromegaly present.  Cardiovascular:  Normal rate and regular rhythm.   Pulmonary/Chest: Effort normal and breath sounds normal.  Abdominal: She exhibits distension.  Lower center abdomen is very firm, mildly distended.  Genitourinary: No vaginal discharge found.  At the distal left opening of the vagina is a small cyst. It is pink, not erythematous. It is regular in shape and soft. It is mildly tender to palpation.  Musculoskeletal: She exhibits edema.  Positive for  pitting, 1+ edema in the medial ankle consistent with baseline. Ending well below the knee.  Lymphadenopathy:    She has no cervical adenopathy.  Neurological: She is alert.  Patient is oriented x2.  Skin: Skin is warm and dry. No pallor.  Psychiatric: She has a normal mood and affect. Her behavior is normal.  She is conversant and verbally appropriate.    Assessment/plan:  Constipation, subsequent KUB showed moderate amount of feces in the colon. Patient had an enema which was effective.  Will need to be more aggressive with the Senokot S, and Miralax, 17gm once a day  Vaginal cyst, no active intervention is necessary, more appropriate to monitor. Staff is to notify if there are any changes such as purulent drainage or erythema.  Anemia, will continue the multivitamins with iron 3 times a day as well as  vitamin C 500 mg, once a day.  BMP ordered for 08/08/2013

## 2013-09-05 NOTE — Progress Notes (Signed)
08/06/2013    Patient ID: Madison Holmes, female   DOB: [birth year], 79 y.o.   MRN: [id number] [facility]  08/06/2013   [facility]urs[facility] Home  MRN:  [id number]   Description: 79 year old female  Provider: Cari Caraway, FNP  Department: [facility]        Diagnoses      Vaginal cyst    -  Primary      623.8      Unspecified constipation          564.00      Anemia          285.9             Reason for Visit      Acute Visit            Current Vitals - Last Recorded      BP Pulse Temp(Src) Resp Wt        115/69 83 97.1 F (36.2 C) 18 95 lb (43.092 kg)              Progress Notes      Cari Caraway, FNP at 09/05/2013  3:35 PM      Status: Signed              graphic 08/06/2013 CODE STATUS DO NOT RESUSCITATE [name] Rm# [id number]  Patient ID: Madison Holmes, female   DOB: 11/19/1919, 79 y.o.   MRN: [id number]    Allergies   Allergen  Reactions   .  Iodine        Chief Complaint   Patient presents with   .  Acute Visit    HPI:  Nursing staff verbalized concern this a.m., regarding a "red" area near the vaginal opening.       Current Outpatient Prescriptions on File Prior to Visit   Medication  Sig  Dispense  Refill   .  acetaminophen (TYLENOL) 500 MG tablet  Take 1,000 mg by mouth 3 (three) times daily.         Marland Kitchen  aspirin 81 MG tablet  Take 81 mg by mouth daily.         .  hydroxypropyl methylcellulose (ISOPTO TEARS) 2.5 % ophthalmic solution  Place 1 drop into both eyes 3 (three) times daily.         .  Melatonin 3 MG CAPS  Take 6 mg by mouth at bedtime.          Marland Kitchen  omeprazole (PRILOSEC) 20 MG capsule  Take 20 mg by mouth 2 (two) times daily before a meal.          .  polyethylene glycol (MIRALAX / GLYCOLAX) packet  Take 17 g by mouth 2 (two) times daily.          .  sennosides-docusate sodium (SENOKOT-S) 8.6-50 MG tablet  Take 2 tablets by mouth 2 (two) times daily.          .  traZODone (DESYREL) 25 mg TABS  Take 0.5 tablets (25 mg total) by mouth  at bedtime.   15 tablet   99      No current facility-administered medications on file prior to visit.    Vitamin B12 2000 mcg once a week and then 1000 mcg per day Vitamin C 500 mg per day K-dur 20 meq per day x 5 days then will have recheck of BMP on 08/08/2013 Albuterol Nebulizer, 0.83%, to be given every six hours as needed. O2  at 2 liters via nasal canula as needed Multi-vitamin with minerals, three times a day started at last visit. Calcium with D, 600/200 once a day  BMP from 07/31/2013 Sodium 132 Potassium 3.7 Chloride 99 CO2 26 AGAP 7 Glucose 154 BUN 14 Creatinine 0.7 BUN/CR 20.9 Calcium 9.2  Complete blood count from 07/28/2013 WBC 4.4 RBC 2.6 Hemoglobin 7.8 Hematocrit 27.0 MCV 102.7 MCH 29.7 MCHC 28.9 RDW 16.1 Platelets 265  BP 115/69  Pulse 83  Temp(Src) 97.1 F (36.2 C)  Resp 18  Wt 95 lb (43.092 kg)  Weight in October was 92.6 pounds.  Physical Exam  Constitutional:  A thin Caucasian female who is in no acute distress.  HENT:   Head: Normocephalic and atraumatic.  Right Ear: External ear normal.  Left Ear: External ear normal.   Mouth/Throat: No oropharyngeal exudate.  Eyes: Right eye exhibits no discharge. Left eye exhibits no discharge. No scleral icterus.  Neck: No thyromegaly present.  Cardiovascular: Normal rate and regular rhythm.   Pulmonary/Chest: Effort normal and breath sounds normal.  Abdominal: She exhibits distension.  Lower center abdomen is very firm, mildly distended.  Genitourinary: No vaginal discharge found.  At the distal left opening of the vagina is a small cyst. It is pink, not erythematous. It is regular in shape and soft. It is mildly tender to palpation.  Musculoskeletal: She exhibits edema.  Positive for  pitting, 1+ edema in the medial ankle consistent with baseline. Ending well below the knee.  Lymphadenopathy:   She has no cervical adenopathy.  Neurological: She is alert.  Patient is oriented x2.  Skin: Skin  is warm and dry. No pallor.  Psychiatric: She has a normal mood and affect. Her behavior is normal.  She is conversant and verbally appropriate.   Assessment/plan:  Constipation, subsequent KUB showed moderate amount of feces in the colon. Patient had an enema which was effective.  Will need to be more aggressive with the Senokot S, and Miralax, 17gm once a day  Vaginal cyst, no active intervention is necessary, more appropriate to monitor. Staff is to notify if there are any changes such as purulent drainage or erythema.  Anemia, will continue the multivitamins with iron 3 times a day as well as vitamin C 500 mg, once a day.  Repeat BMP 08/08/2013

## 2013-09-05 NOTE — Addendum Note (Signed)
Addended by: Zachery Dauer on: 09/05/2013 08:55 PM   Modules accepted: Level of Service, SmartSet

## 2013-09-05 NOTE — Addendum Note (Signed)
Addended by: Zachery Dauer on: 09/05/2013 09:18 PM   Modules accepted: Level of Service, SmartSet

## 2013-09-07 MED ORDER — FUROSEMIDE 20 MG PO TABS: 20.0000 mg | ORAL_TABLET | Freq: Two times a day (BID) | ORAL | Status: DC

## 2013-09-07 NOTE — Progress Notes (Signed)
Patient ID: Madison Holmes, female   DOB: [birth year], 79 y.o.   MRN: [id number]     [facility]  Allergies  Allergen Reactions  . Iodine      Chief Complaint  Patient presents with  . Acute Visit    follow up chf    HPI:  No problem-specific assessment & plan notes found for this encounter.   Past Medical History  Diagnosis Date  . A-fib 03/13/2013  . Anemia 03/13/2013  . Dementia 03/13/2013  . TIA (transient ischemic attack) 03/13/2013  . Frail elderly 03/13/2013  . Unspecified constipation 03/13/2013  . Myelodysplastic syndrome, unspecified 03/13/2013  . PVD (peripheral vascular disease) 03/13/2013  . CVA (cerebral vascular accident) 03/13/2013    No past surgical history on file.  VITAL SIGNS There were no vitals taken for this visit.   Patient's Medications  New Prescriptions   No medications on file  Previous Medications   ACETAMINOPHEN (TYLENOL) 500 MG TABLET    Take 1,000 mg by mouth 3 (three) times daily.   ASPIRIN 81 MG TABLET    Take 81 mg by mouth daily.   FUROSEMIDE (LASIX) 20 MG TABLET    Take 20 mg by mouth 2 (two) times daily.    HYDROXYPROPYL METHYLCELLULOSE (ISOPTO TEARS) 2.5 % OPHTHALMIC SOLUTION    Place 1 drop into both eyes 3 (three) times daily.   MELATONIN 3 MG CAPS    Take 6 mg by mouth at bedtime.    OMEPRAZOLE (PRILOSEC) 20 MG CAPSULE    Take 20 mg by mouth 2 (two) times daily before a meal.    POLYETHYLENE GLYCOL (MIRALAX / GLYCOLAX) PACKET    Take 17 g by mouth 2 (two) times daily.    POTASSIUM CHLORIDE SA (K-DUR,KLOR-CON) 20 MEQ TABLET    Take 20 mEq by mouth daily.   SENNOSIDES-DOCUSATE SODIUM (SENOKOT-S) 8.6-50 MG TABLET    Take 2 tablets by mouth 2 (two) times daily.    TRAZODONE (DESYREL) 25 MG TABS    Take 0.5 tablets (25 mg total) by mouth at bedtime.  Modified Medications   No medications on file  Discontinued Medications   No medications on file    SIGNIFICANT DIAGNOSTIC EXAMS    12-27-12: right shoulder x-ray: no fracture  or dislocation 12-27-12: left shoulder x-ray: no fracture or dislocation; mild degenerative change in the acromioclavicular joint  12-27-12: thoracic spine x-ray: osteoporotic compression fracture at T6 and T8 likely subacute or more remote in nature; moderate bony demineralization; mild dextroscoliosis of lower thoracic spine. 12-27-12: lumbar spine x-ray: no fracture or dislocation involving lumbar spine. Moderate bony demineralization. Mild osteoarthritic change   08-06-13: kub: no bowel obstruction or ileus; no significant abnormal calcifications within the abdomen; mild to moderate retained feces at the rectosigmoid colon  09-03-13: chest x-ray: minimal cardiomegaly decreased with moderate to severe pulmonary vascular congestion improved. Small left pleural effusion, new. Patchy pneumonitis or edema at the mid and lower lungs bilaterally overall improved.  09-03-13: ekg: afib 09-04-13: chest x-ray: findings persistent, consistent with chf. Patchy Insterstitial changes both lower lungs are present, consistent with both pneumonia and interstitial pulmonary edema. The left pleural effusion appears slightly more pronounced than on the previous survey. Moderate to large hiatus hernia.     LABS REVIEWED:   12-26-12: wbc 6.6; hgb 8.3; hct 25.3 ;mcv 102.4; plt 219; glucose 72; bun 15; creat 0.75; k+3.7 Na++136 03-04-13: urine culture: no growth 03-14-13: wbc 5.1; hgb 9.0; hct 29.0 ;mcv 103.2; plt 262; glucose 85;  bun 16; creat 0.7; k+3.2 Na++137 03-17-13: k+ 3.6 05-21-13: wbc 7.6; hgb 7.6 hct 25.1; mcv 303.3; plt 302;  05-30-13: wbc 6.4; hgb 7.9; hct 26.6; mcv 104.3; plt 264 06-20-13: glucose 115; bun 19; creat 0.6; k+3.7; na++132 07-07-13: glucose 87;bun 11; creat 0.7; k+4.0; na+ 132 07-21-13: iron 39; ferritin 444; folic acid 14.040; vit b12: 201 08-28-13: glucose 149; bun 17; creat 0.5; k+4.0; na++ 133  09-04-13: glucose 109; bun 15; creat 0.6; k+3.7; na++130; BNP 683    Review of Systems    Constitutional: Negative for malaise/fatigue.  Respiratory: Negative for cough and shortness of breath.   Cardiovascular: Negative for chest pain, palpitations positive leg edema   Gastrointestinal: Negative for abdominal pain. Musculoskeletal: no pain present.  Skin: Negative.   Neurological: Negative for headaches.  Psychiatric/Behavioral: The patient no insomnia     Physical Exam  Constitutional: No distress.  Cathectic   Neck: Neck supple. No JVD present. No thyromegaly present.  Cardiovascular: Normal rate and intact distal pulses.   Heart rate irregular   Respiratory:  Has increased respiratory effort present; has diminished lung sounds bilaterally  GI: Soft. Bowel sounds are normal. She exhibits no distension. There is no tenderness.  Musculoskeletal: Normal range of motion. She exhibits edema.  Has 3+ pitting edema thighs down  Neurological: She is alert.  Skin: Skin is warm and dry. She is not diaphoretic.      ASSESSMENT/ PLAN:  1. Afib: her heart rate is stable will continue her asa 81 mg daily and will continue to monitor her status.   2. CHF: will change her lasix to 40 mg in the am and 20 mg in the pm and will check a bmp in one week will set up a 2-d echo and will continue to monitor  her status  3. Pneumonia: will begin avelox 400 mg daily for 10 days with florstor twice daily for 10 days as probiotic and will monitor her status.

## 2013-09-09 ENCOUNTER — Encounter: Payer: Self-pay | Admitting: Adult Health

## 2013-09-09 ENCOUNTER — Non-Acute Institutional Stay (SKILLED_NURSING_FACILITY): Payer: Medicare Other | Admitting: Adult Health

## 2013-09-09 DIAGNOSIS — I509 Heart failure, unspecified: Secondary | ICD-10-CM

## 2013-09-09 DIAGNOSIS — J189 Pneumonia, unspecified organism: Secondary | ICD-10-CM

## 2013-09-09 NOTE — Progress Notes (Signed)
   Date of visit is 06/17/2013  [facility], [place] [address] CODE STATUS DO NOT RESUSCITATE Patient ID: Madison Holmes, female   DOB: June 24, 1920, 79 y.o.   MRN: [id number]  Allergies  Allergen Reactions  . Iodine     Chief Complaint  Patient presents with  . Acute Visit   HPI:  Responding to lab value of 3.1 potassium.  Review of Systems: Pt verbalizes no new c/o  Current Outpatient Prescriptions on File Prior to Visit  Medication Sig Dispense Refill  . acetaminophen (TYLENOL) 500 MG tablet Take 1,000 mg by mouth 3 (three) times daily.      Marland Kitchen aspirin 81 MG tablet Take 81 mg by mouth daily.      . hydroxypropyl methylcellulose (ISOPTO TEARS) 2.5 % ophthalmic solution Place 1 drop into both eyes 3 (three) times daily.      . Melatonin 3 MG CAPS Take 6 mg by mouth at bedtime.       Marland Kitchen omeprazole (PRILOSEC) 20 MG capsule Take 20 mg by mouth 2 (two) times daily before a meal.       . polyethylene glycol (MIRALAX / GLYCOLAX) packet Take 17 g by mouth 2 (two) times daily.       . sennosides-docusate sodium (SENOKOT-S) 8.6-50 MG tablet Take 2 tablets by mouth 2 (two) times daily.       . traZODone (DESYREL) 25 mg TABS Take 0.5 tablets (25 mg total) by mouth at bedtime.  15 tablet  99   No current facility-administered medications on file prior to visit.   Past Medical History  Diagnosis Date  . A-fib 03/13/2013  . Anemia 03/13/2013  . Dementia 03/13/2013  . TIA (transient ischemic attack) 03/13/2013  . Frail elderly 03/13/2013  . Unspecified constipation 03/13/2013  . Myelodysplastic syndrome, unspecified 03/13/2013  . PVD (peripheral vascular disease) 03/13/2013  . CVA (cerebral vascular accident) 03/13/2013   Most recent lab from 06/16/2013 Sodium 133 Potassium 3.1 Chloride 96 CO2 28 AGAP 8 Glucose 90 BUN 15 Creatinine 0.7 BUN/CR 22.2 Calcium 8  Complete blood count done 04/25/2013 WBC 6.3 RBC 2.5 Hemoglobin 8.0 Hematocrit 26.2 MCV 103.6 MCH 31.6 MCHC 30.5 RDW  14.1 Platelets 228  BP 138/65  Full vital signs not taken for this visit  Physical examination:  Alert, verbally appropriate in no acute distress. Bilateral breath sounds are completely clear. Apical pulse is irregularly irregular. Bilateral lower extremities with no edema.  Assessment/plan  Hypokalemia, will implement 20 mEq of potassium per day x3 days, and then recheck BMP on 06/20/2013  We will continue to monitor.

## 2013-09-09 NOTE — Progress Notes (Signed)
Patient ID: Madison Holmes, female   DOB: 1920/04/09, 79 y.o.   MRN: [id number]     [facility]  Allergies  Allergen Reactions  . Iodine     Chief Complaint  Patient presents with  . Acute Visit    follow up chf and pneumonia     HPI:  She is being seen to follow up with her pneumonia and chf. She is slowly improving. Her edema is less. Although her blood pressure is high today; her normal range is around 138/70; this will be monitored. Her 2-d echo was done today there is a preliminary report but are awaiting the final report to return. She is not voicing any concerns.   Past Medical History  Diagnosis Date  . A-fib 03/13/2013  . Anemia 03/13/2013  . Dementia 03/13/2013  . TIA (transient ischemic attack) 03/13/2013  . Frail elderly 03/13/2013  . Unspecified constipation 03/13/2013  . Myelodysplastic syndrome, unspecified 03/13/2013  . PVD (peripheral vascular disease) 03/13/2013  . CVA (cerebral vascular accident) 03/13/2013    No past surgical history on file.  VITAL SIGNS BP 176/80  Pulse 95  Ht 5\' 5"  (1.651 m)  Wt 109 lb 9.6 oz (49.714 kg)  BMI 18.24 kg/m2   Patient's Medications  New Prescriptions   No medications on file  Previous Medications   ACETAMINOPHEN (TYLENOL) 500 MG TABLET    Take 1,000 mg by mouth 3 (three) times daily.   ASPIRIN 81 MG TABLET    Take 81 mg by mouth daily.   FUROSEMIDE (LASIX) 20 MG TABLET    Take 1-2 tablets (20-40 mg total) by mouth 2 (two) times daily. Take 40 mg in the Am and 20 mg in the PM   HYDROXYPROPYL METHYLCELLULOSE (ISOPTO TEARS) 2.5 % OPHTHALMIC SOLUTION    Place 1 drop into both eyes 3 (three) times daily.   MELATONIN 3 MG CAPS    Take 6 mg by mouth at bedtime.    OMEPRAZOLE (PRILOSEC) 20 MG CAPSULE    Take 20 mg by mouth 2 (two) times daily before a meal.    POLYETHYLENE GLYCOL (MIRALAX / GLYCOLAX) PACKET    Take 17 g by mouth 2 (two) times daily.    POTASSIUM CHLORIDE SA (K-DUR,KLOR-CON) 20 MEQ TABLET    Take 20 mEq by  mouth daily.   SENNOSIDES-DOCUSATE SODIUM (SENOKOT-S) 8.6-50 MG TABLET    Take 2 tablets by mouth 2 (two) times daily.    TRAZODONE (DESYREL) 25 MG TABS    Take 0.5 tablets (25 mg total) by mouth at bedtime.  Modified Medications   No medications on file  Discontinued Medications   No medications on file    SIGNIFICANT DIAGNOSTIC EXAMS    12-27-12: right shoulder x-ray: no fracture or dislocation 12-27-12: left shoulder x-ray: no fracture or dislocation; mild degenerative change in the acromioclavicular joint  12-27-12: thoracic spine x-ray: osteoporotic compression fracture at T6 and T8 likely subacute or more remote in nature; moderate bony demineralization; mild dextroscoliosis of lower thoracic spine. 12-27-12: lumbar spine x-ray: no fracture or dislocation involving lumbar spine. Moderate bony demineralization. Mild osteoarthritic change   08-06-13: kub: no bowel obstruction or ileus; no significant abnormal calcifications within the abdomen; mild to moderate retained feces at the rectosigmoid colon  09-03-13: chest x-ray: minimal cardiomegaly decreased with moderate to severe pulmonary vascular congestion improved. Small left pleural effusion, new. Patchy pneumonitis or edema at the mid and lower lungs bilaterally overall improved.  09-03-13: ekg: afib 09-04-13: chest x-ray: findings  persistent, consistent with chf. Patchy Insterstitial changes both lower lungs are present, consistent with both pneumonia and interstitial pulmonary edema. The left pleural effusion appears slightly more pronounced than on the previous survey. Moderate to large hiatus hernia.  09-09-13: 2-d echo (prelimary report) moderate to severe tricuspid regurg; mid to moderate mitral regurg; moderate aortic insufficiency.   LABS REVIEWED:   12-26-12: wbc 6.6; hgb 8.3; hct 25.3 ;mcv 102.4; plt 219; glucose 72; bun 15; creat 0.75; k+3.7 Na++136 03-04-13: urine culture: no growth 03-14-13: wbc 5.1; hgb 9.0; hct 29.0 ;mcv  103.2; plt 262; glucose 85; bun 16; creat 0.7; k+3.2 Na++137 03-17-13: k+ 3.6 05-21-13: wbc 7.6; hgb 7.6 hct 25.1; mcv 303.3; plt 302;  05-30-13: wbc 6.4; hgb 7.9; hct 26.6; mcv 104.3; plt 264 06-20-13: glucose 115; bun 19; creat 0.6; k+3.7; na++132 07-07-13: glucose 87;bun 11; creat 0.7; k+4.0; na+ 132 07-21-13: iron 39; ferritin 444; folic acid 14.040; vit b12: 201 08-28-13: glucose 149; bun 17; creat 0.5; k+4.0; na++ 133  09-04-13: glucose 109; bun 15; creat 0.6; k+3.7; na++130; BNP 683    Review of Systems  Constitutional: Negative for malaise/fatigue.  Respiratory: Negative for cough and shortness of breath.   Cardiovascular: Negative for chest pain, palpitations positive leg edema   Gastrointestinal: Negative for abdominal pain. Musculoskeletal: no pain present.  Skin: Negative.   Neurological: Negative for headaches.  Psychiatric/Behavioral: The patient no insomnia     Physical Exam  Constitutional: No distress.  Cathectic   Neck: Neck supple. No JVD present. No thyromegaly present.  Cardiovascular: Normal rate and intact distal pulses.   Heart rate irregular   Respiratory: respirations even and non-labored lungs clear  GI: Soft. Bowel sounds are normal. She exhibits no distension. There is no tenderness.  Musculoskeletal: Normal range of motion. She exhibits edema.  Has 2+ knees down  Neurological: She is alert.  Skin: Skin is warm and dry. She is not diaphoretic.    ASSESSMENT/PLAN  1. Chf/plan: will continue her current plan of care and will not further changes at this time; will continue to monitor her status and will make changes in the future as indicated.

## 2013-10-02 ENCOUNTER — Encounter: Payer: Self-pay | Admitting: Internal Medicine

## 2013-10-02 ENCOUNTER — Non-Acute Institutional Stay (SKILLED_NURSING_FACILITY): Payer: Medicare Other | Admitting: Internal Medicine

## 2013-10-02 DIAGNOSIS — K219 Gastro-esophageal reflux disease without esophagitis: Secondary | ICD-10-CM

## 2013-10-02 DIAGNOSIS — I509 Heart failure, unspecified: Secondary | ICD-10-CM

## 2013-10-02 DIAGNOSIS — R609 Edema, unspecified: Secondary | ICD-10-CM

## 2013-10-02 DIAGNOSIS — E876 Hypokalemia: Secondary | ICD-10-CM

## 2013-10-02 DIAGNOSIS — D649 Anemia, unspecified: Secondary | ICD-10-CM

## 2013-10-02 DIAGNOSIS — G4700 Insomnia, unspecified: Secondary | ICD-10-CM

## 2013-10-02 NOTE — Progress Notes (Signed)
Patient ID: Madison Holmes, female   DOB: [birth year], 80 y.o.   MRN: [id number]    [facility]  Chief Complaint  Patient presents with  . Medical Managment of Chronic Issues   Allergies  Allergen Reactions  . Iodine     HPI 80 y/o female patient is here for long term care and seen today for routine visit. She is having her lunch and in no distress. She denies any complaints besides ocassional arthritis pain bothering her. No concern from staff. breathing is stable  Review of Systems  Constitutional: Negative for malaise/fatigue.  Respiratory: Negative for cough and shortness of breath.   Cardiovascular: Negative for chest pain, palpitations  Gastrointestinal: Negative for abdominal pain. Musculoskeletal: back and joint pain, leg edema positive Skin: Negative.   Neurological: Negative for headaches.  Psychiatric/Behavioral: The patient no insomnia   Past Medical History  Diagnosis Date  . A-fib 03/13/2013  . Anemia 03/13/2013  . Dementia 03/13/2013  . TIA (transient ischemic attack) 03/13/2013  . Frail elderly 03/13/2013  . Unspecified constipation 03/13/2013  . Myelodysplastic syndrome, unspecified 03/13/2013  . PVD (peripheral vascular disease) 03/13/2013  . CVA (cerebral vascular accident) 03/13/2013   Medication reviewed. See [facility]  Physical exam Vital signs reviewed  Constitutional: frail elderly female, thin built, in no acute distress Neck: Neck supple. No JVD present. No thyromegaly present.  Cardiovascular: irregular heart rate, no murmur, intact dorsalis pedis, edema in both feet upto ankle  Respiratory: respirations even and non-labored lungs clear   GI: Soft. Bowel sounds are normal. She exhibits no distension. There is no tenderness.  Musculoskeletal: Normal range of motion.  Neurological: She is alert.  Skin: Skin is warm and excessively dry. She is not diaphoretic.   Labs 12-26-12: wbc 6.6; hgb 8.3; hct 25.3 ;mcv 102.4; plt 219; glucose 72; bun 15;  creat 0.75; k+3.7 Na++136 03-04-13: urine culture: no growth 03-14-13: wbc 5.1; hgb 9.0; hct 29.0 ;mcv 103.2; plt 262; glucose 85; bun 16; creat 0.7; k+3.2 Na++137 03-17-13: k+ 3.6 05-21-13: wbc 7.6; hgb 7.6 hct 25.1; mcv 303.3; plt 302;   05-30-13: wbc 6.4; hgb 7.9; hct 26.6; mcv 104.3; plt 264 06-20-13: glucose 115; bun 19; creat 0.6; k+3.7; na++132 07-07-13: glucose 87;bun 11; creat 0.7; k+4.0; na+ 132 07-21-13: iron 39; ferritin 151; folic acid 76.160; vit b12: 201 08-28-13: glucose 149; bun 17; creat 0.5; k+4.0; na++ 133   09-04-13: glucose 109; bun 15; creat 0.6; k+3.7; na++130; BNP 683   Assessment/plan  CHF- stable. Reviewed echocardiogram 12/14 showing EF 60% with moderate AI, MR which is expected with her age and dilated LA. Currently off all cardiac medications except lasix. Monitor clinically. Symptom free at present  Hypokalemia- continue kcl supplement and monitor bmp  GERD- continue omeprazole 20 mg bid, monitor symptoms  Constipation- stable with miralax and senna-s, monitor  Edema- continue lasix 40 mg daily with kcl supplement, legs to be elevated at rest, improved from before  Insomnia- trazodone with melatonin has been helpful currently, monitor clinically  Anemia- currently on iron supplement, monitor cbc periodically  Labs- cbc, bmp in 1 month  Code status- dnr

## 2013-10-07 ENCOUNTER — Non-Acute Institutional Stay (SKILLED_NURSING_FACILITY): Payer: Medicare Other | Admitting: Adult Health

## 2013-10-07 ENCOUNTER — Encounter: Payer: Self-pay | Admitting: Adult Health

## 2013-10-07 DIAGNOSIS — K469 Unspecified abdominal hernia without obstruction or gangrene: Secondary | ICD-10-CM

## 2013-10-07 NOTE — Progress Notes (Signed)
Patient ID: Madison Holmes, female   DOB: Aug 03, 1920, 80 y.o.   MRN: [id number]     [facility]  Allergies  Allergen Reactions  . Iodine      Chief Complaint  Patient presents with  . Acute Visit    abdominal hernia    HPI:  She was found this morning to have a large hernia present in her mid lower abdomen. She is not complaining of pain present. Her bowel sounds are normal her abdomen is soft. Her stool was positive guaiac. There are no reports of changes in her appetite or bowel patterns.    Past Medical History  Diagnosis Date  . A-fib 03/13/2013  . Anemia 03/13/2013  . Dementia 03/13/2013  . TIA (transient ischemic attack) 03/13/2013  . Frail elderly 03/13/2013  . Unspecified constipation 03/13/2013  . Myelodysplastic syndrome, unspecified 03/13/2013  . PVD (peripheral vascular disease) 03/13/2013  . CVA (cerebral vascular accident) 03/13/2013    No past surgical history on file.  VITAL SIGNS BP 137/82  Pulse 78  Ht 5\' 5"  (1.651 m)  Wt 108 lb 6.4 oz (49.17 kg)  BMI 18.04 kg/m2   Patient's Medications  New Prescriptions   No medications on file  Previous Medications   ACETAMINOPHEN (TYLENOL) 500 MG TABLET    Take 1,000 mg by mouth 3 (three) times daily.   ALBUTEROL (PROVENTIL) (2.5 MG/3ML) 0.083% NEBULIZER SOLUTION    Take 2.5 mg by nebulization every 6 (six) hours as needed for wheezing or shortness of breath.   ASPIRIN 81 MG TABLET    Take 81 mg by mouth daily.   FERROUS SULFATE 325 (65 FE) MG TABLET    Take 325 mg by mouth daily with breakfast.   FUROSEMIDE (LASIX) 20 MG TABLET    Take 1-2 tablets (20-40 mg total) by mouth 2 (two) times daily. Take 40 mg in the Am and 20 mg in the PM   HYDROXYPROPYL METHYLCELLULOSE (ISOPTO TEARS) 2.5 % OPHTHALMIC SOLUTION    Place 1 drop into both eyes 3 (three) times daily.   MELATONIN 3 MG CAPS    Take 6 mg by mouth at bedtime.    OMEPRAZOLE (PRILOSEC) 20 MG CAPSULE    Take 20 mg by mouth 2 (two) times daily before a meal.      POLYETHYLENE GLYCOL (MIRALAX / GLYCOLAX) PACKET    Take 17 g by mouth 2 (two) times daily.    POTASSIUM CHLORIDE SA (K-DUR,KLOR-CON) 20 MEQ TABLET    Take 20 mEq by mouth daily.   SENNOSIDES-DOCUSATE SODIUM (SENOKOT-S) 8.6-50 MG TABLET    Take 2 tablets by mouth 2 (two) times daily.    TRAZODONE (DESYREL) 25 MG TABS    Take 0.5 tablets (25 mg total) by mouth at bedtime.  Modified Medications   No medications on file  Discontinued Medications   No medications on file    SIGNIFICANT DIAGNOSTIC EXAMS  12-27-12: right shoulder x-ray: no fracture or dislocation  12-27-12: left shoulder x-ray: no fracture or dislocation; mild degenerative change in the acromioclavicular joint   12-27-12: thoracic spine x-ray: osteoporotic compression fracture at T6 and T8 likely subacute or more remote in nature; moderate bony demineralization; mild dextroscoliosis of lower thoracic spine.  12-27-12: lumbar spine x-ray: no fracture or dislocation involving lumbar spine. Moderate bony demineralization. Mild osteoarthritic change    08-06-13: kub: no bowel obstruction or ileus; no significant abnormal calcifications within the abdomen; mild to moderate retained feces at the rectosigmoid colon   09-03-13: chest  x-ray: minimal cardiomegaly decreased with moderate to severe pulmonary vascular congestion improved. Small left pleural effusion, new. Patchy pneumonitis or edema at the mid and lower lungs bilaterally overall improved.   09-03-13: ekg: afib  09-04-13: chest x-ray: findings persistent, consistent with chf. Patchy Insterstitial changes both lower lungs are present, consistent with both pneumonia and interstitial pulmonary edema. The left pleural effusion appears slightly more pronounced than on the previous survey. Moderate to large hiatus hernia.   09-09-13: 2-d echo (prelimary report) moderate to severe tricuspid regurg; mid to moderate mitral regurg; moderate aortic insufficiency.        LABS  REVIEWED:   12-26-12: wbc 6.6; hgb 8.3; hct 25.3 ;mcv 102.4; plt 219; glucose 72; bun 15; creat 0.75; k+3.7 Na++136 03-04-13: urine culture: no growth 03-14-13: wbc 5.1; hgb 9.0; hct 29.0 ;mcv 103.2; plt 262; glucose 85; bun 16; creat 0.7; k+3.2 Na++137 03-17-13: k+ 3.6 05-21-13: wbc 7.6; hgb 7.6 hct 25.1; mcv 303.3; plt 302;  05-30-13: wbc 6.4; hgb 7.9; hct 26.6; mcv 104.3; plt 264 06-20-13: glucose 115; bun 19; creat 0.6; k+3.7; na++132 07-07-13: glucose 87;bun 11; creat 0.7; k+4.0; na+ 132 07-21-13: iron 39; ferritin 401; folic acid 02.725; vit b12: 201 08-28-13: glucose 149; bun 17; creat 0.5; k+4.0; na++ 133  09-04-13: glucose 109; bun 15; creat 0.6; k+3.7; na++130; BNP 683  10-07-13: guaiac stool +       Review of Systems  Constitutional: Negative for malaise/fatigue.  Respiratory: Negative for cough and shortness of breath.   Cardiovascular: Negative for chest pain, palpitations no edema   Gastrointestinal: Negative for abdominal pain. Musculoskeletal: no pain present.  Skin: Negative.   Neurological: Negative for headaches.  Psychiatric/Behavioral: The patient no insomnia     Physical Exam  Constitutional: No distress.  Cathectic   Neck: Neck supple. No JVD present. No thyromegaly present.  Cardiovascular: Normal rate and intact distal pulses.   Heart rate irregular   Respiratory: respirations even and non-labored lungs clear  GI: Soft. Bowel sounds are normal. She exhibits no distension. There is no tenderness. has large mid-lower abdominal hernia present.  Musculoskeletal: Normal range of motion. She exhibits edema.  Has 1+ knees down  Neuroogical: She is alert.  Skin: Skin is warm and dry. She is not diaphoretic.     ASSESSMENT/ PLAN:  1. Abdominal hernia: will setup an abdominal ultrasound today; and will have her seen by GI in the am as this is a new finding for her. Will get a cbc for her due to her positive guaiac and will continue to monitor her status. I  have spoken with her sons.   Time spent with patient 40 minutes.

## 2013-10-08 ENCOUNTER — Ambulatory Visit (INDEPENDENT_AMBULATORY_CARE_PROVIDER_SITE_OTHER): Payer: Medicare Other | Admitting: Physician Assistant

## 2013-10-08 ENCOUNTER — Encounter: Payer: Self-pay | Admitting: Physician Assistant

## 2013-10-08 VITALS — BP 137/82 | HR 76

## 2013-10-08 DIAGNOSIS — R1900 Intra-abdominal and pelvic swelling, mass and lump, unspecified site: Secondary | ICD-10-CM

## 2013-10-08 NOTE — Patient Instructions (Signed)
You have been scheduled for a CT scan of the abdomen and pelvis at [facility] ([address]---this is in the same building as [facility]).   You are scheduled on 10-13-2013 at 2:30 pm. You should arrive 15 minutes prior to your appointment time for registration. Please follow the written instructions below on the day of your exam:  WARNING: IF YOU ARE ALLERGIC TO IODINE/X-RAY DYE, PLEASE NOTIFY RADIOLOGY IMMEDIATELY AT [phone]! YOU WILL BE GIVEN A 13 HOUR PREMEDICATION PREP.  1) Do not eat or drink anything after 10:30 am (4 hours prior to your test) 2) You have been given 2 bottles of oral contrast to drink. The solution may taste better if refrigerated, but do NOT add ice or any other liquid to this solution. Shake well before drinking.    Drink 1 bottle of contrast @ 12:30 pm (2 hours prior to your exam)  Drink 1 bottle of contrast @ 1:30 pm (1 hour prior to your exam)  You may take any medications as prescribed with a small amount of water except for the following: Metformin, Glucophage, Glucovance, Avandamet, Riomet, Fortamet, Actoplus Met, Janumet, Glumetza or Metaglip. The above medications must be held the day of the exam AND 48 hours after the exam.  The purpose of you drinking the oral contrast is to aid in the visualization of your intestinal tract. The contrast solution may cause some diarrhea. Before your exam is started, you will be given a small amount of fluid to drink. Depending on your individual set of symptoms, you may also receive an intravenous injection of x-ray contrast/dye. Plan on being at [facility] for 30 minutes or long, depending on the type of exam you are having performed.  This test typically takes 30-45 minutes to complete.  If you have any questions regarding your exam or if you need to reschedule, you may call the CT department at [phone] between the hours of 8:00 am and 5:00 pm,  Monday-Friday.  ________________________________________________________________________

## 2013-10-08 NOTE — Progress Notes (Signed)
Subjective:    Patient ID: Madison Holmes, female    DOB: 08/30/1920, 80 y.o.   MRN: [id number]  HPI  Madison Holmes  is a 80 year old female referred by [employer] [facility]. Patient is a resident at [facility]. She has history of several TIA/CVA"s, dementia, atrial fibrillation, chronic anemia felt secondary to a myelodysplastic syndrome and congestive heart failure. Patient's family relates that she has remote history of colon cancer 20-25 years ago for which he underwent a resection at [facility]. She has not had any other abdominal surgery that they're aware of. Patient was noted apparently yesterday at the nursing home to have an abdominal mass which had not been noted previously and is therefore referred. She underwent an upper about her ultrasound today at [facility] and those results are pending. Patient's family says she does not eat well but they've not noted any changes recently. She has had long-term problems with coughing and choking at times with her meals. She has not been complaining of abdominal pain. They are uncertain whether she's been having problems with constipation. She is incontinent of urine. No recent nausea or vomiting. Most recent labs September 2014 WBC of 6.4 hemoglobin 17.9 hematocrit 26.6 MCV of 104 platelets 264 be met in December 2014 creatinine 0.6 BNP was 683. Patient is a poor historian, when asked whether her abdomen hurts she says sometimes it's   uncomfortable.   Report of upper abdominal ultrasound done earlier today by [facility] was obtained and this shows a large complex predominantly solid mass extending from the pelvis into the lower abdomen measuring 15.3 x 16.6 x 16.5 cm very little calcifications seen the lesion appears vascular most consistent with a very large fibroid uterus but CT is suggested.     Review of Systems  HENT: Negative.   Eyes: Negative.   Respiratory: Positive for cough and choking.   Cardiovascular:  Negative.   Gastrointestinal: Negative.   Endocrine: Negative.   Genitourinary: Positive for enuresis.  Musculoskeletal: Positive for gait problem.  Skin: Negative.   Allergic/Immunologic: Negative.   Neurological: Positive for weakness.  Hematological: Negative.   Psychiatric/Behavioral: Negative.    Outpatient Prescriptions Prior to Visit  Medication Sig Dispense Refill  . acetaminophen (TYLENOL) 500 MG tablet Take 1,000 mg by mouth 3 (three) times daily.      Marland Kitchen albuterol (PROVENTIL) (2.5 MG/3ML) 0.083% nebulizer solution Take 2.5 mg by nebulization every 6 (six) hours as needed for wheezing or shortness of breath.      Marland Kitchen aspirin 81 MG tablet Take 81 mg by mouth daily.      . ferrous sulfate 325 (65 FE) MG tablet Take 325 mg by mouth daily with breakfast.      . hydroxypropyl methylcellulose (ISOPTO TEARS) 2.5 % ophthalmic solution Place 1 drop into both eyes 3 (three) times daily.      . Melatonin 3 MG CAPS Take 6 mg by mouth at bedtime.       Marland Kitchen omeprazole (PRILOSEC) 20 MG capsule Take 20 mg by mouth 2 (two) times daily before a meal.       . polyethylene glycol (MIRALAX / GLYCOLAX) packet Take 17 g by mouth 2 (two) times daily.       . potassium chloride SA (K-DUR,KLOR-CON) 20 MEQ tablet Take 20 mEq by mouth daily.      . sennosides-docusate sodium (SENOKOT-S) 8.6-50 MG tablet Take 2 tablets by mouth 2 (two) times daily.       . traZODone (  DESYREL) 25 mg TABS Take 0.5 tablets (25 mg total) by mouth at bedtime.  15 tablet  99  . furosemide (LASIX) 20 MG tablet Take 1-2 tablets (20-40 mg total) by mouth 2 (two) times daily. Take 40 mg in the Am and 20 mg in the PM  90 tablet  11   No facility-administered medications prior to visit.   Allergies  Allergen Reactions  . Contrast Media [Iodinated Diagnostic Agents] Anaphylaxis  . Iodine    Patient Active Problem List   Diagnosis Date Noted  . Abdominal hernia 10/07/2013  . Edema 10/02/2013  . HAP (hospital-acquired pneumonia)  09/05/2013  . Congestive heart failure, unspecified 09/05/2013  . Dementia without behavioral disturbance 09/01/2013  . GERD (gastroesophageal reflux disease) 04/18/2013  . Chronic pain 04/18/2013  . Insomnia 04/18/2013  . A-fib 03/13/2013  . Anemia 03/13/2013  . TIA (transient ischemic attack) 03/13/2013  . Frail elderly 03/13/2013  . Unspecified constipation 03/13/2013  . Myelodysplastic syndrome, unspecified 03/13/2013  . PVD (peripheral vascular disease) 03/13/2013  . CVA (cerebral vascular accident) 03/13/2013   History  Substance Use Topics  . Smoking status: Never Smoker   . Smokeless tobacco: Never Used  . Alcohol Use: No   family history includes Diabetes in her son. There is no history of Heart disease, Kidney disease, Liver disease, Colon cancer, Throat cancer, or Pancreatic cancer.     Objective:   Physical Exam   well-developed elderly white female in no acute distress sitting in a wheelchair she is nonambulatory. Patient accompanied by her 2 sons. HEENT ;nontraumatic normocephalic EOMI PERRLA, Supple; no JVD, Cardiovascular; irregular rate and rhythm with S1-S2 she is a soft systolic murmur Pulmonary; clear bilaterally Abdomen; patient is kyphotic and sitting for exam that she appears to have a very large rounded smooth abdominal mass extending up into the mid epigastric area she is nontender bowel sounds are present, Rectal; exam not done, Extremities; no clubbing cyanosis or edema skin warm and dry, Psych ;patient is demented but cooperative        Assessment & Plan:  #54    80 year old female with a large pelvic mass, by ultrasound most consistent with a large fibroid uterus. Rule out other pelvic lesion-i.e. ovarian origin. #2 dementia #3 history of multiple TIA/CVA #4 history of atrial fibrillation #5 congestive heart failure #6 peripheral vascular disease #7 myelodysplastic syndrome with chronic anemia  Plan; will schedule for CT scan of the abdomen and  pelvis with oral but no IV contrast as patient has a contrast allergy and her family does not want  her to have IV contrast even with premedication. I discussed the finding of the ultrasound with her sons, this may be a benign lesion that has been present for a long time. CT scan will be helpful to further delineate. Given her advanced age and multiple comorbidities , dementia etc. may not want to be aggressive with any further workup    CC Dr. Melene Plan [facility]

## 2013-10-08 NOTE — Progress Notes (Signed)
Reviewed and agree with initial management plan.  Othon Guardia T. Cristel Rail, MD FACG 

## 2013-10-13 ENCOUNTER — Other Ambulatory Visit: Payer: Medicare Other

## 2013-10-13 ENCOUNTER — Non-Acute Institutional Stay (SKILLED_NURSING_FACILITY): Payer: Medicare Other | Admitting: Adult Health

## 2013-10-13 DIAGNOSIS — D649 Anemia, unspecified: Secondary | ICD-10-CM

## 2013-10-13 DIAGNOSIS — I6350 Cerebral infarction due to unspecified occlusion or stenosis of unspecified cerebral artery: Secondary | ICD-10-CM

## 2013-10-13 DIAGNOSIS — I509 Heart failure, unspecified: Secondary | ICD-10-CM

## 2013-10-13 DIAGNOSIS — I639 Cerebral infarction, unspecified: Secondary | ICD-10-CM

## 2013-10-13 DIAGNOSIS — F0390 Unspecified dementia without behavioral disturbance: Secondary | ICD-10-CM

## 2013-10-16 ENCOUNTER — Encounter: Payer: Self-pay | Admitting: Adult Health

## 2013-10-16 NOTE — Progress Notes (Signed)
Patient ID: Madison Holmes, female   DOB: [birth year], 80 y.o.   MRN: [id number]     [facility]  Allergies  Allergen Reactions  . Contrast Media [Iodinated Diagnostic Agents] Anaphylaxis  . Iodine      Chief Complaint  Patient presents with  . Acute Visit    change in patient status     HPI:  She has had a change in her condition. She is weaker; has been unable to take her medications. The staff has been unable to have her take the prep for her ct scan scheduled for today; so this will have to be canceled. I have had an extensive discussion with her son regarding her overall status and her poor prognosis. He does understand and does not want aggressive care to be given.    Past Medical History  Diagnosis Date  . A-fib   . Anemia   . Dementia   . TIA (transient ischemic attack)   . Frail elderly   . Unspecified constipation   . Myelodysplastic syndrome, unspecified   . PVD (peripheral vascular disease)   . CVA (cerebral vascular accident)   . Colon cancer     Past Surgical History  Procedure Laterality Date  . Small intestine surgery      due to colon cancer  . Carotid endarterectomy      with stents    VITAL SIGNS BP 160/70  Pulse 80  Ht 5\' 5"  (1.651 m)  Wt 108 lb 6.4 oz (49.17 kg)  BMI 18.04 kg/m2   Patient's Medications  New Prescriptions   No medications on file  Previous Medications   ACETAMINOPHEN (TYLENOL) 500 MG TABLET    Take 1,000 mg by mouth 3 (three) times daily.   ALBUTEROL (PROVENTIL) (2.5 MG/3ML) 0.083% NEBULIZER SOLUTION    Take 2.5 mg by nebulization every 6 (six) hours as needed for wheezing or shortness of breath.   ASPIRIN 81 MG TABLET    Take 81 mg by mouth daily.   CALCIUM CARB-CHOLECALCIFEROL (CALCIUM-VITAMIN D) 600-400 MG-UNIT TABS    Take 1 tablet by mouth 2 (two) times daily.   DIBUCAINE (NUPERCAINAL) 1 % OINTMENT    Apply 1 application topically as needed for pain.   FERROUS SULFATE 325 (65 FE) MG TABLET    Take 325 mg by  mouth daily with breakfast.   FUROSEMIDE (LASIX) 20 MG TABLET    Take 40 mg in the Am and 20 mg in the PM   HYDROXYPROPYL METHYLCELLULOSE (ISOPTO TEARS) 2.5 % OPHTHALMIC SOLUTION    Place 1 drop into both eyes 3 (three) times daily.   MELATONIN 3 MG CAPS    Take 6 mg by mouth at bedtime.    MULTIPLE VITAMINS-MINERALS (CERTA-VITE PO)    Take 1 tablet by mouth 3 (three) times daily.   OMEPRAZOLE (PRILOSEC) 20 MG CAPSULE    Take 20 mg by mouth 2 (two) times daily before a meal.    POLYETHYLENE GLYCOL (MIRALAX / GLYCOLAX) PACKET    Take 17 g by mouth 2 (two) times daily.    POTASSIUM CHLORIDE SA (K-DUR,KLOR-CON) 20 MEQ TABLET    Take 20 mEq by mouth daily.   SENNOSIDES-DOCUSATE SODIUM (SENOKOT-S) 8.6-50 MG TABLET    Take 2 tablets by mouth 2 (two) times daily.    SKIN PROTECTANTS, MISC. (ENDIT EX)    Apply topically.   TRAZODONE (DESYREL) 25 MG TABS    Take 0.5 tablets (25 mg total) by mouth at bedtime.   VITAMIN  B-12 (CYANOCOBALAMIN) 1000 MCG TABLET    Take 1,000 mcg by mouth daily.   VITAMIN C (ASCORBIC ACID) 500 MG TABLET    Take 500 mg by mouth daily.  Modified Medications   No medications on file  Discontinued Medications   No medications on file    SIGNIFICANT DIAGNOSTIC EXAMS   12-27-12: right shoulder x-ray: no fracture or dislocation  12-27-12: left shoulder x-ray: no fracture or dislocation; mild degenerative change in the acromioclavicular joint   12-27-12: thoracic spine x-ray: osteoporotic compression fracture at T6 and T8 likely subacute or more remote in nature; moderate bony demineralization; mild dextroscoliosis of lower thoracic spine.  12-27-12: lumbar spine x-ray: no fracture or dislocation involving lumbar spine. Moderate bony demineralization. Mild osteoarthritic change    08-06-13: kub: no bowel obstruction or ileus; no significant abnormal calcifications within the abdomen; mild to moderate retained feces at the rectosigmoid colon   09-03-13: chest x-ray: minimal  cardiomegaly decreased with moderate to severe pulmonary vascular congestion improved. Small left pleural effusion, new. Patchy pneumonitis or edema at the mid and lower lungs bilaterally overall improved.   09-03-13: ekg: afib  09-04-13: chest x-ray: findings persistent, consistent with chf. Patchy Insterstitial changes both lower lungs are present, consistent with both pneumonia and interstitial pulmonary edema. The left pleural effusion appears slightly more pronounced than on the previous survey. Moderate to large hiatus hernia.   09-09-13: 2-d echo (prelimary report) moderate to severe tricuspid regurg; mid to moderate mitral regurg; moderate aortic insufficiency.   10-08-13: abdominal ultrasound: very large complex but predominantly solid mass extending from the pelvis into the lower abdomen suggestive of large fibroid uterus 15.3 x 16.6 x 16.5 cm      LABS REVIEWED:   12-26-12: wbc 6.6; hgb 8.3; hct 25.3 ;mcv 102.4; plt 219; glucose 72; bun 15; creat 0.75; k+3.7 Na++136 03-04-13: urine culture: no growth 03-14-13: wbc 5.1; hgb 9.0; hct 29.0 ;mcv 103.2; plt 262; glucose 85; bun 16; creat 0.7; k+3.2 Na++137 03-17-13: k+ 3.6 05-21-13: wbc 7.6; hgb 7.6 hct 25.1; mcv 303.3; plt 302;  05-30-13: wbc 6.4; hgb 7.9; hct 26.6; mcv 104.3; plt 264 06-20-13: glucose 115; bun 19; creat 0.6; k+3.7; na++132 07-07-13: glucose 87;bun 11; creat 0.7; k+4.0; na+ 132 07-21-13: iron 39; ferritin 254; folic acid 98.264; vit b12: 201 08-28-13: glucose 149; bun 17; creat 0.5; k+4.0; na++ 133  09-04-13: glucose 109; bun 15; creat 0.6; k+3.7; na++130; BNP 683  10-07-13: guaiac stool + 10-08-13: wbc 7.8; hgb 7.0; hct 22.2; mcv 103.3; plt 261      Review of Systems  Unable to perform ROS   Physical Exam  Constitutional: No distress.  Cathectic   Neck: Neck supple. No JVD present. No thyromegaly present.  Cardiovascular: Normal rate and intact distal pulses.   Heart rate irregular   Respiratory: respirations  even and non-labored lungs clear  GI: Soft. Bowel sounds are normal. She exhibits no distension. There is no tenderness. has large mid-lower abdominal hernia present.  Musculoskeletal: Normal range of motion. She exhibits edema.  Has 1+ knees down  Neuroogical: She is alert.  Skin: Skin is warm and dry. She is not diaphoretic.     ASSESSMENT/ PLAN:  Her family is going to consider hospice care at this time and will let me know their decision.  Her son is fairly certain they will decide upon hospice care but wants to speak with his brother first. Will cancel the ct scan at this time.  Since she is increased  difficulty taking medications will stop her routine medications and will monitor her for signs of discomfort and will treat accordingly.   Time spent with patient 45 minutes.

## 2013-10-18 DIAGNOSIS — Z681 Body mass index (BMI) 19 or less, adult: Secondary | ICD-10-CM

## 2013-10-18 DIAGNOSIS — F0390 Unspecified dementia without behavioral disturbance: Secondary | ICD-10-CM

## 2013-10-18 DIAGNOSIS — R5381 Other malaise: Secondary | ICD-10-CM

## 2013-10-18 DIAGNOSIS — D469 Myelodysplastic syndrome, unspecified: Secondary | ICD-10-CM

## 2013-10-22 ENCOUNTER — Non-Acute Institutional Stay (SKILLED_NURSING_FACILITY): Payer: Medicare Other | Admitting: Adult Health

## 2013-10-22 ENCOUNTER — Encounter: Payer: Self-pay | Admitting: Adult Health

## 2013-10-22 DIAGNOSIS — I639 Cerebral infarction, unspecified: Secondary | ICD-10-CM

## 2013-10-22 DIAGNOSIS — I6350 Cerebral infarction due to unspecified occlusion or stenosis of unspecified cerebral artery: Secondary | ICD-10-CM

## 2013-10-22 DIAGNOSIS — F0390 Unspecified dementia without behavioral disturbance: Secondary | ICD-10-CM

## 2013-10-22 DIAGNOSIS — R627 Adult failure to thrive: Secondary | ICD-10-CM | POA: Insufficient documentation

## 2013-10-22 MED ORDER — MORPHINE SULFATE (CONCENTRATE) 10 MG /0.5 ML PO SOLN: 5.0000 mg | ORAL | Status: AC | PRN

## 2013-10-22 NOTE — Progress Notes (Signed)
Patient ID: Madison Holmes, female   DOB: 1920-09-08, 80 y.o.   MRN: [id number]    [facility]   Allergies  Allergen Reactions  . Contrast Media [Iodinated Diagnostic Agents] Anaphylaxis  . Iodine      Chief Complaint  Patient presents with  . Acute Visit    pain management     HPI:  She is having pain. She cannot fully participate in the hpi or ros; but did state that she hurts at time. She cannot tell me where she is hurting.  Will need to further wean her off medications. She does continue to decline. She continues to be followed by hospice care.    Past Medical History  Diagnosis Date  . A-fib   . Anemia   . Dementia   . TIA (transient ischemic attack)   . Frail elderly   . Unspecified constipation   . Myelodysplastic syndrome, unspecified   . PVD (peripheral vascular disease)   . CVA (cerebral vascular accident)   . Colon cancer     Past Surgical History  Procedure Laterality Date  . Small intestine surgery      due to colon cancer  . Carotid endarterectomy      with stents    VITAL SIGNS BP 130/80  Pulse 87  Ht 5\' 5"  (1.651 m)  Wt 108 lb 6.4 oz (49.17 kg)  BMI 18.04 kg/m2   Patient's Medications  New Prescriptions   No medications on file  Previous Medications   ACETAMINOPHEN (TYLENOL) 500 MG TABLET    Take 1,000 mg by mouth 3 (three) times daily.   ALBUTEROL (PROVENTIL) (2.5 MG/3ML) 0.083% NEBULIZER SOLUTION    Take 2.5 mg by nebulization every 6 (six) hours as needed for wheezing or shortness of breath.   DIBUCAINE (NUPERCAINAL) 1 % OINTMENT    Apply 1 application topically as needed for pain.   HYDROXYPROPYL METHYLCELLULOSE (ISOPTO TEARS) 2.5 % OPHTHALMIC SOLUTION    Place 1 drop into both eyes 3 (three) times daily.   MELATONIN 3 MG CAPS    Take 6 mg by mouth at bedtime.    OMEPRAZOLE (PRILOSEC) 20 MG CAPSULE    Take 20 mg by mouth 2 (two) times daily before a meal.    POLYETHYLENE GLYCOL (MIRALAX / GLYCOLAX) PACKET    Take 17 g by mouth 2  (two) times daily.    SENNOSIDES-DOCUSATE SODIUM (SENOKOT-S) 8.6-50 MG TABLET    Take 2 tablets by mouth 2 (two) times daily.    SKIN PROTECTANTS, MISC. (ENDIT EX)    Apply topically.   TRAZODONE (DESYREL) 25 MG TABS    Take 0.5 tablets (25 mg total) by mouth at bedtime.  Modified Medications   No medications on file  Discontinued Medications   No medications on file    SIGNIFICANT DIAGNOSTIC EXAMS  12-27-12: right shoulder x-ray: no fracture or dislocation  12-27-12: left shoulder x-ray: no fracture or dislocation; mild degenerative change in the acromioclavicular joint   12-27-12: thoracic spine x-ray: osteoporotic compression fracture at T6 and T8 likely subacute or more remote in nature; moderate bony demineralization; mild dextroscoliosis of lower thoracic spine.  12-27-12: lumbar spine x-ray: no fracture or dislocation involving lumbar spine. Moderate bony demineralization. Mild osteoarthritic change    08-06-13: kub: no bowel obstruction or ileus; no significant abnormal calcifications within the abdomen; mild to moderate retained feces at the rectosigmoid colon   09-03-13: chest x-ray: minimal cardiomegaly decreased with moderate to severe pulmonary vascular congestion improved. Small left  pleural effusion, new. Patchy pneumonitis or edema at the mid and lower lungs bilaterally overall improved.   09-03-13: ekg: afib  09-04-13: chest x-ray: findings persistent, consistent with chf. Patchy Insterstitial changes both lower lungs are present, consistent with both pneumonia and interstitial pulmonary edema. The left pleural effusion appears slightly more pronounced than on the previous survey. Moderate to large hiatus hernia.   09-09-13: 2-d echo (prelimary report) moderate to severe tricuspid regurg; mid to moderate mitral regurg; moderate aortic insufficiency.   10-08-13: abdominal ultrasound: very large complex but predominantly solid mass extending from the pelvis into the lower  abdomen suggestive of large fibroid uterus 15.3 x 16.6 x 16.5 cm      LABS REVIEWED:   12-26-12: wbc 6.6; hgb 8.3; hct 25.3 ;mcv 102.4; plt 219; glucose 72; bun 15; creat 0.75; k+3.7 Na++136 03-04-13: urine culture: no growth 03-14-13: wbc 5.1; hgb 9.0; hct 29.0 ;mcv 103.2; plt 262; glucose 85; bun 16; creat 0.7; k+3.2 Na++137 03-17-13: k+ 3.6 05-21-13: wbc 7.6; hgb 7.6 hct 25.1; mcv 303.3; plt 302;  05-30-13: wbc 6.4; hgb 7.9; hct 26.6; mcv 104.3; plt 264 06-20-13: glucose 115; bun 19; creat 0.6; k+3.7; na++132 07-07-13: glucose 87;bun 11; creat 0.7; k+4.0; na+ 132 07-21-13: iron 39; ferritin 185; folic acid 63.149; vit b12: 201 08-28-13: glucose 149; bun 17; creat 0.5; k+4.0; na++ 133  09-04-13: glucose 109; bun 15; creat 0.6; k+3.7; na++130; BNP 683  10-07-13: guaiac stool + 10-08-13: wbc 7.8; hgb 7.0; hct 22.2; mcv 103.3; plt 261      Review of Systems  Unable to perform ROS   Physical Exam  Constitutional: No distress.  Cathectic   Neck: Neck supple. No JVD present. No thyromegaly present.  Cardiovascular: Normal rate and intact distal pulses.   Heart rate irregular   Respiratory: respirations even and non-labored lungs clear  GI: Soft. Bowel sounds are normal. She exhibits no distension. There is no tenderness. has large mid-lower abdominal hernia present.  Musculoskeletal: Normal range of motion. She exhibits edema.  Has 1+ knees down  Neuroogical: She is alert.  Skin: Skin is warm and dry. She is not diaphoretic.    ASSESSMENT/ PLAN:    dementia/cva/ftt: will begin her on roxanol 5 mg every 4 hours as needed. She is not anxious or distressed. Will stop: routine tylenol; prilosec melatonin; trazodone; miralax. Will use dulcolax supp daily as needed for constipation and will continue to monitor her status.   Time spent with patient 30 minutes.

## 2013-10-24 ENCOUNTER — Encounter: Payer: Self-pay | Admitting: Internal Medicine

## 2013-10-28 ENCOUNTER — Non-Acute Institutional Stay (SKILLED_NURSING_FACILITY): Payer: Medicare Other | Admitting: Adult Health

## 2013-10-28 ENCOUNTER — Encounter: Payer: Self-pay | Admitting: Adult Health

## 2013-10-28 DIAGNOSIS — F0390 Unspecified dementia without behavioral disturbance: Secondary | ICD-10-CM

## 2013-10-28 DIAGNOSIS — R627 Adult failure to thrive: Secondary | ICD-10-CM

## 2013-10-28 NOTE — Progress Notes (Signed)
Patient ID: Madison Holmes, female   DOB: 1920/04/19, 80 y.o.   MRN: [id number]     [facility]  Allergies  Allergen Reactions  . Contrast Media [Iodinated Diagnostic Agents] Anaphylaxis  . Iodine      Chief Complaint  Patient presents with  . Medical Managment of Chronic Issues    HPI:  She is bineg seen for the management of her chronic illnesses. She is followed by hospice care. She is actively dying. The only medication that she is taking at this time is roxanol for pain. She does have some increased secretions present and will benefit from atropine drops under her tongue for this. There are no concerns being voiced by the nursing staff at this time. Her family is at bedside. She is not in distress at this time.   Past Medical History  Diagnosis Date  . A-fib   . Anemia   . Dementia   . TIA (transient ischemic attack)   . Frail elderly   . Unspecified constipation   . Myelodysplastic syndrome, unspecified   . PVD (peripheral vascular disease)   . CVA (cerebral vascular accident)   . Colon cancer     Past Surgical History  Procedure Laterality Date  . Small intestine surgery      due to colon cancer  . Carotid endarterectomy      with stents    VITAL SIGNS BP 106/67  Pulse 102  Ht 5\' 5"  (1.651 m)  Wt 105 lb 3.2 oz (47.718 kg)  BMI 17.51 kg/m2   Patient's Medications  New Prescriptions   No medications on file  Previous Medications   HYDROXYPROPYL METHYLCELLULOSE (ISOPTO TEARS) 2.5 % OPHTHALMIC SOLUTION    Place 1 drop into both eyes 3 (three) times daily.   MORPHINE SULFATE (MORPHINE CONCENTRATE) 10 MG / 0.5 ML CONCENTRATED SOLUTION    Take 0.25 mLs (5 mg total) by mouth every 4 (four) hours as needed for severe pain.   SKIN PROTECTANTS, MISC. (ENDIT EX)    Apply topically.  Modified Medications   No medications on file  Discontinued Medications   ALBUTEROL (PROVENTIL) (2.5 MG/3ML) 0.083% NEBULIZER SOLUTION    Take 2.5 mg by nebulization every 6  (six) hours as needed for wheezing or shortness of breath.   DIBUCAINE (NUPERCAINAL) 1 % OINTMENT    Apply 1 application topically as needed for pain.   SENNOSIDES-DOCUSATE SODIUM (SENOKOT-S) 8.6-50 MG TABLET    Take 2 tablets by mouth 2 (two) times daily.     SIGNIFICANT DIAGNOSTIC EXAMS  12-27-12: right shoulder x-ray: no fracture or dislocation  12-27-12: left shoulder x-ray: no fracture or dislocation; mild degenerative change in the acromioclavicular joint   12-27-12: thoracic spine x-ray: osteoporotic compression fracture at T6 and T8 likely subacute or more remote in nature; moderate bony demineralization; mild dextroscoliosis of lower thoracic spine.  12-27-12: lumbar spine x-ray: no fracture or dislocation involving lumbar spine. Moderate bony demineralization. Mild osteoarthritic change    08-06-13: kub: no bowel obstruction or ileus; no significant abnormal calcifications within the abdomen; mild to moderate retained feces at the rectosigmoid colon   09-03-13: chest x-ray: minimal cardiomegaly decreased with moderate to severe pulmonary vascular congestion improved. Small left pleural effusion, new. Patchy pneumonitis or edema at the mid and lower lungs bilaterally overall improved.   09-03-13: ekg: afib  09-04-13: chest x-ray: findings persistent, consistent with chf. Patchy Insterstitial changes both lower lungs are present, consistent with both pneumonia and interstitial pulmonary edema. The left pleural  effusion appears slightly more pronounced than on the previous survey. Moderate to large hiatus hernia.   09-09-13: 2-d echo (prelimary report) moderate to severe tricuspid regurg; mid to moderate mitral regurg; moderate aortic insufficiency.   10-08-13: abdominal ultrasound: very large complex but predominantly solid mass extending from the pelvis into the lower abdomen suggestive of large fibroid uterus 15.3 x 16.6 x 16.5 cm      LABS REVIEWED:   12-26-12: wbc 6.6; hgb 8.3;  hct 25.3 ;mcv 102.4; plt 219; glucose 72; bun 15; creat 0.75; k+3.7 Na++136 03-04-13: urine culture: no growth 03-14-13: wbc 5.1; hgb 9.0; hct 29.0 ;mcv 103.2; plt 262; glucose 85; bun 16; creat 0.7; k+3.2 Na++137 03-17-13: k+ 3.6 05-21-13: wbc 7.6; hgb 7.6 hct 25.1; mcv 303.3; plt 302;  05-30-13: wbc 6.4; hgb 7.9; hct 26.6; mcv 104.3; plt 264 06-20-13: glucose 115; bun 19; creat 0.6; k+3.7; na++132 07-07-13: glucose 87;bun 11; creat 0.7; k+4.0; na+ 132 07-21-13: iron 39; ferritin 829; folic acid 93.716; vit b12: 201 08-28-13: glucose 149; bun 17; creat 0.5; k+4.0; na++ 133  09-04-13: glucose 109; bun 15; creat 0.6; k+3.7; na++130; BNP 683  10-07-13: guaiac stool + 10-08-13: wbc 7.8; hgb 7.0; hct 22.2; mcv 103.3; plt 261      Review of Systems  Unable to perform ROS   Physical Exam  Constitutional: No distress.  Cathectic   Neck: Neck supple. No JVD present. No thyromegaly present.  Cardiovascular: Normal rate and intact distal pulses.   Heart rate irregular   Respiratory: respirations even and non-labored lungs clear  GI: Soft. Bowel sounds are normal. She exhibits no distension. There is no tenderness. has large mid-lower abdominal hernia present.  Musculoskeletal: Normal range of motion. She exhibits edema.  Has 1+ knees down  Neuroogical:not alert  Skin: Skin is warm and dry. She is not diaphoretic.     ASSESSMENT/ PLAN:  1. Dementia without behavioral disturbance; ftt: will continue her roxanol 5 mg every 4 hours as needed for pain at this time. Will begin atropine drops 4 drops under tongue as needed for increased secretions. Will continue to monitor her status and will continue to focus her care upon comfort only.

## 2013-10-29 ENCOUNTER — Other Ambulatory Visit: Payer: Self-pay | Admitting: *Deleted

## 2013-10-29 MED ORDER — MORPHINE SULFATE (CONCENTRATE) 20 MG/ML PO SOLN: ORAL | Status: AC

## 2013-10-29 NOTE — Telephone Encounter (Signed)
[facility] 

## 2013-11-16 DEATH — deceased
